# Patient Record
Sex: Male | Born: 1965
Health system: Southern US, Community
[De-identification: ages and names within clinical notes are randomized; demographics above are authoritative.]

## PROBLEM LIST (undated history)

## (undated) DIAGNOSIS — R112 Nausea with vomiting, unspecified: Secondary | ICD-10-CM

## (undated) DIAGNOSIS — Z9889 Other specified postprocedural states: Secondary | ICD-10-CM

## (undated) DIAGNOSIS — H409 Unspecified glaucoma: Secondary | ICD-10-CM

## (undated) DIAGNOSIS — E785 Hyperlipidemia, unspecified: Secondary | ICD-10-CM

## (undated) DIAGNOSIS — R011 Cardiac murmur, unspecified: Secondary | ICD-10-CM

## (undated) DIAGNOSIS — T8859XA Other complications of anesthesia, initial encounter: Secondary | ICD-10-CM

## (undated) DIAGNOSIS — L719 Rosacea, unspecified: Secondary | ICD-10-CM

## (undated) HISTORY — PX: VASECTOMY: SHX75

## (undated) HISTORY — DX: Hyperlipidemia, unspecified: E78.5

## (undated) HISTORY — DX: Unspecified glaucoma: H40.9

## (undated) HISTORY — DX: Rosacea, unspecified: L71.9

## (undated) HISTORY — PX: ANKLE SURGERY: SHX546

## (undated) HISTORY — DX: Cardiac murmur, unspecified: R01.1

## (undated) HISTORY — PX: TONSILLECTOMY AND ADENOIDECTOMY: SUR1326

## (undated) HISTORY — PX: COLONOSCOPY: SHX174

---

## 2004-10-30 ENCOUNTER — Emergency Department (HOSPITAL_COMMUNITY): Admission: EM | Admit: 2004-10-30 | Discharge: 2004-10-30 | Payer: Self-pay | Admitting: Emergency Medicine

## 2004-12-30 HISTORY — PX: SHOULDER SURGERY: SHX246

## 2005-11-27 ENCOUNTER — Ambulatory Visit: Payer: Self-pay | Admitting: Internal Medicine

## 2010-09-05 ENCOUNTER — Ambulatory Visit: Payer: Self-pay | Admitting: Sports Medicine

## 2010-09-05 DIAGNOSIS — M25569 Pain in unspecified knee: Secondary | ICD-10-CM | POA: Insufficient documentation

## 2010-09-05 DIAGNOSIS — E785 Hyperlipidemia, unspecified: Secondary | ICD-10-CM | POA: Insufficient documentation
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
## 2011-07-08 ENCOUNTER — Ambulatory Visit (INDEPENDENT_AMBULATORY_CARE_PROVIDER_SITE_OTHER): Payer: BC Managed Care – PPO | Admitting: Sports Medicine

## 2011-07-08 ENCOUNTER — Encounter: Payer: Self-pay | Admitting: Sports Medicine

## 2011-07-08 VITALS — BP 119/78 | HR 56 | Ht 72.0 in | Wt 195.0 lb

## 2011-07-08 DIAGNOSIS — M25572 Pain in left ankle and joints of left foot: Secondary | ICD-10-CM

## 2011-07-08 DIAGNOSIS — M25579 Pain in unspecified ankle and joints of unspecified foot: Secondary | ICD-10-CM

## 2011-07-08 NOTE — Assessment & Plan Note (Addendum)
Try conservative care with ASO Limit plantar flexion Would not push exercises but rather limit plantar flexion over next 6 weeks  Ck ankle films   Recheck in 6 weeks

## 2011-07-08 NOTE — Progress Notes (Signed)
  Subjective:    Patient ID: URBAN NAVAL, male    DOB: [birth year], 45 y.o.   MRN: [id number]  HPI  Pt presents to clinic today for evaluation of left posterior ankle pain that he has had for the past 6 months started when he was training for a marathon this past winter.  Pain is directly behind lateral and medial malleolus with plantar flexion.  Completed marathon this past May.  Pain worsened 1.5 months ago when he stepped wrong off of a step into extreme plantar flexion.  Now has ankle pain  Hx of achilles tendonitis x 3-4 years ago, thought this resolved.    this flared after running a marathon.  He has a history of remote right ankle surgery for an os trigone.   Review of Systems     Objective:   Physical Exam    Klieger test neg Ankle ligaments stable No swelling Tenderness is minimal Pain with ankle plantar flexion at 30 deg AT normal Mildly suppinated foot with bunionettes Normal arch No calcaneal valgus Scar on rt medial ankle- os trigon surgery    MSK [place] The AT is intact and normal at 0.42 cms Peroneal tendons are intact and normal on long and trans view Calcaneus appears normal Deep in post clear space there is an indistinct circular area of tissue that may be a fibrous type nodule No bony os trigone is identified on [place] at least      Assessment & Plan:

## 2011-07-09 ENCOUNTER — Ambulatory Visit
Admission: RE | Admit: 2011-07-09 | Discharge: 2011-07-09 | Disposition: A | Discharge status: Home / Self Care | Payer: BC Managed Care – PPO | Source: Ambulatory Visit | Attending: Sports Medicine | Admitting: Sports Medicine

## 2011-07-09 ENCOUNTER — Other Ambulatory Visit: Payer: Self-pay | Admitting: Sports Medicine

## 2011-07-09 DIAGNOSIS — M25572 Pain in left ankle and joints of left foot: Secondary | ICD-10-CM

## 2011-07-10 ENCOUNTER — Telehealth: Payer: Self-pay | Admitting: *Deleted

## 2011-07-10 NOTE — Telephone Encounter (Signed)
Per Dr. Darrick Penna advised pt he does not have a bony os trigon, but may have a cartilaginous os trigon, and continue treatment plan as discussed.  Pt expressed understanding.   Pt wanted to know if it was ok for him to ride his road bike and do lower body resistance training such as leg press.   Per Dr. Darrick Penna advised pt- ok to ride road bike, but not uphill, and he should avoid activities that put foot in plantar flexion such as leg presses, and squats.

## 2011-08-06 ENCOUNTER — Ambulatory Visit: Payer: BC Managed Care – PPO | Admitting: Sports Medicine

## 2011-10-10 ENCOUNTER — Ambulatory Visit (INDEPENDENT_AMBULATORY_CARE_PROVIDER_SITE_OTHER): Payer: BC Managed Care – PPO | Admitting: Sports Medicine

## 2011-10-10 ENCOUNTER — Encounter: Payer: Self-pay | Admitting: Sports Medicine

## 2011-10-10 VITALS — BP 138/88 | HR 58

## 2011-10-10 DIAGNOSIS — M25572 Pain in left ankle and joints of left foot: Secondary | ICD-10-CM

## 2011-10-10 DIAGNOSIS — M25579 Pain in unspecified ankle and joints of unspecified foot: Secondary | ICD-10-CM

## 2011-10-10 NOTE — Progress Notes (Signed)
  Subjective:    Patient ID: Stephen Arellano, male    DOB: 1966-01-20, 45 y.o.   MRN: [id number]  HPI  Pt presents to clinic for f/u of lt posterior ankle pain which he reports is improved in daily walking, but still painful with plantar flexion. States with forced plantar flexion, such as stepping down a steep step gets pain Uses ASO consistently since last visit, and limited motion.  No injuries since last visit He still gets too much pain in his posterior ankle to run  X-ray showed a prominent posterior process of the talus  He had similar symptoms in the right ankle 17 years ago and had surgery for an os trigonum and that resolved his pain       Review of Systems     Objective:   Physical Exam  No acute distress  Ankle: No visible erythema or swelling. Range of motion is full in all directions. Strength is 5/5 in all directions. Stable lateral and medial ligaments; squeeze test and kleiger test unremarkable; Talar dome nontender; No pain at base of 5th MT; No tenderness over cuboid; No tenderness over N spot or navicular prominence No tenderness on posterior aspects of lateral and medial malleolus No sign of peroneal tendon subluxations; Negative tarsal tunnel tinel's Able to walk 4 steps.  MSK ultrasound The Achilles tendon and the ankle looks normal on scan In the posterior talus there is a calcified spur There is some soft tissue swelling around this as well as some probable calcification in the soft tissue       Assessment & Plan:

## 2011-10-10 NOTE — Assessment & Plan Note (Signed)
I think that the spur and the prominent posterior process of the talus are symptomatically acting like an os trigonum injury  Reviewed these findings with the orthopedic surgeon to see if surgical intervention might be helpful

## 2012-01-09 ENCOUNTER — Telehealth: Payer: Self-pay | Admitting: Internal Medicine

## 2012-01-09 NOTE — Telephone Encounter (Signed)
Please bring me his chart for review. Usually when a patient transfers, they did so for a valid reason.

## 2012-01-09 NOTE — Telephone Encounter (Signed)
Dr.Hopper please advise 

## 2012-01-09 NOTE — Telephone Encounter (Signed)
Patient states that the last time he saw Dr. Alwyn Ren was four years ago but would like to re establish with him. Please advise if this is okay.

## 2012-01-10 NOTE — Telephone Encounter (Signed)
Chart is on the ledge.

## 2012-01-10 NOTE — Telephone Encounter (Signed)
Patient made appt for 01/15/12

## 2012-01-10 NOTE — Telephone Encounter (Signed)
OK; he should F/U his elevated cholesterol

## 2012-01-10 NOTE — Telephone Encounter (Signed)
lmom informing patient that per it was okay to re establish per Dr. Alwyn Ren.

## 2012-01-15 ENCOUNTER — Encounter: Payer: Self-pay | Admitting: Internal Medicine

## 2012-01-15 ENCOUNTER — Ambulatory Visit (INDEPENDENT_AMBULATORY_CARE_PROVIDER_SITE_OTHER): Payer: BC Managed Care – PPO | Admitting: Internal Medicine

## 2012-01-15 DIAGNOSIS — Z0000 Encounter for general adult medical examination without abnormal findings: Secondary | ICD-10-CM

## 2012-01-15 DIAGNOSIS — E785 Hyperlipidemia, unspecified: Secondary | ICD-10-CM

## 2012-01-15 LAB — BASIC METABOLIC PANEL
BUN: 11 mg/dL (ref 6–23)
Calcium: 9.1 mg/dL (ref 8.4–10.5)
Creatinine, Ser: 1 mg/dL (ref 0.4–1.5)
GFR: 87.31 mL/min (ref >60.00)
Glucose, Bld: 102 mg/dL — ABNORMAL HIGH (ref 70–99)
Potassium: 3.8 mEq/L (ref 3.5–5.1)
Sodium: 140 mEq/L (ref 135–145)

## 2012-01-15 LAB — LIPID PANEL
Cholesterol: 224 mg/dL — ABNORMAL HIGH (ref 0–200)
HDL: 51.2 mg/dL (ref >39.00)
Total CHOL/HDL Ratio: 4
VLDL: 12.2 mg/dL (ref 0.0–40.0)

## 2012-01-15 LAB — CBC WITH DIFFERENTIAL/PLATELET
Basophils Absolute: 0 10*3/uL (ref 0.0–0.1)
Basophils Relative: 0.6 % (ref 0.0–3.0)
Lymphocytes Relative: 31.9 % (ref 12.0–46.0)
Lymphs Abs: 1.8 10*3/uL (ref 0.7–4.0)
MCV: 85.8 fl (ref 78.0–100.0)
Monocytes Absolute: 0.5 10*3/uL (ref 0.1–1.0)
Monocytes Relative: 8.9 % (ref 3.0–12.0)
Neutro Abs: 3.1 10*3/uL (ref 1.4–7.7)
Platelets: 210 10*3/uL (ref 150.0–400.0)
RBC: 4.83 Mil/uL (ref 4.22–5.81)
RDW: 14 % (ref 11.5–14.6)
WBC: 5.8 10*3/uL (ref 4.5–10.5)

## 2012-01-15 LAB — HEPATIC FUNCTION PANEL
AST: 30 U/L (ref 0–37)
Alkaline Phosphatase: 54 U/L (ref 39–117)
Total Bilirubin: 0.7 mg/dL (ref 0.3–1.2)
Total Protein: 7 g/dL (ref 6.0–8.3)

## 2012-01-15 LAB — TSH: TSH: 0.72 u[IU]/mL (ref 0.35–5.50)

## 2012-01-15 NOTE — Patient Instructions (Signed)
Preventive Health Care: Exercise at least 30-45 minutes a day,  3-4 days a week.  Eat a low-fat diet with lots of fruits and vegetables, up to 7-9 servings per day. Consume less than 40 grams of sugar per day from foods & drinks with High Fructose Corn Sugar as # 1,2,3 or # 4 on label. Alcohol If you drink, do it moderately,less than 9 drinks per week, preferably less than 6 @ most. Health Care Power of Attorney & Living Will. Complete if not in place ; these place you in charge of your health care decisions. Blood Pressure Goal  Ideally is an AVERAGE < 135/85. This AVERAGE should be calculated from @ least 5-7 BP readings taken @ different times of day on different days of week. You should not respond to isolated BP readings , but rather the AVERAGE for that week.  To prevent palpitations or premature beats, avoid stimulants such as decongestants, diet pills, nicotine, or caffeine (coffee, tea, cola, or chocolate) to excess.

## 2012-01-15 NOTE — Progress Notes (Signed)
Subjective:    Patient ID: Stephen Arellano, male    DOB: 11/17/1966, 46 y.o.   MRN: [id number]  HPI  Stephen Arellano is here for a physical;acute issues include possible HTN      Review of Systems ? HYPERTENSION: Onset : 11/2011; BP 145/90 @ [facility] Trigger: ? Increased salt intake Disease Monitoring  Blood pressure range: 122/80-high above  Chest pain: no   Dyspnea: no DOE ; he has awakened infrequently with palpitations and dyspnea. He has no palpitations with exertion.  Claudication: no   Lightheadedness: no   Urinary frequency: no   Edema: no    Preventitive Healthcare:  Exercise: yes, 3X/week as gym & biking 40-50 miles per week . He completed a marathon  05/12  Diet Pattern: no plan  Salt Restriction: modified FH: MGF HTN, MI  He denies hematuria or dysuria. He also denies abdominal pain, melena, or rectal bleeding.      Objective:   Physical Exam Gen.: Healthy , toned & well-nourished in appearance. Alert, appropriate and cooperative throughout exam. Head: Normocephalic without obvious abnormalities  Eyes: No corneal or conjunctival inflammation noted. Pupils: ? minimal  anisoria, OS > OD. Slight ptosis OD. Fundal exam is benign without hemorrhages, exudate, papilledema. Extraocular motion intact. Vision grossly normal. Ears: External  ear exam reveals no significant lesions or deformities. Canals clear; minimal osteomata  .TMs normal. Hearing is grossly normal bilaterally. Nose: External nasal exam reveals no deformity or inflammation. Nasal mucosa are pink and moist. No lesions or exudates noted.   Mouth: Oral mucosa and oropharynx reveal no lesions or exudates. Teeth in good repair. Neck: No deformities, masses, or tenderness noted. Range of motion & Thyroid normal. Lungs: Normal respiratory effort; chest expands symmetrically. Lungs are clear to auscultation without rales, wheezes, or increased work of breathing. Heart: Normal rate and rhythm. Normal S1 and S2. No gallop,  click, or rub. Grade 1/6 systolic murmur  Abdomen: Bowel sounds normal; abdomen soft and nontender. No masses, organomegaly or hernias noted. Genitalia/DRE:  Varices and granuloma on the left. Prostate is normal without enlargement, nodularity, asymmetry, or induration .                                                                                   Musculoskeletal/extremities: No deformity or scoliosis noted of  the thoracic or lumbar spine. No clubbing, cyanosis, edema, or deformity noted. Range of motion  normal .Tone & strength  normal.Joints normal. Nail health  good. Vascular: Carotid, radial artery, dorsalis pedis and  posterior tibial pulses are full and equal. No bruits present. Neurologic: Alert and oriented x3. Deep tendon reflexes symmetrical but 1/2+ @ knees.        Skin: Intact without suspicious lesions or rashes. Lymph: No cervical, axillary, or inguinal lymphadenopathy present. Psych: Mood and affect are normal. Normally interactive  Assessment & Plan:  #1 comprehensive physical exam; no acute findings  #2 palpitations and dyspnea infrequently at night. No exercise associated symptoms. If symptoms recur , Holter monitor could be pursued. #3 see Problem List with Assessments & Recommendations Plan: see Orders

## 2012-01-21 LAB — HEMOGLOBIN A1C: Hgb A1c MFr Bld: 5.5 % (ref 4.6–6.5)

## 2012-02-12 ENCOUNTER — Ambulatory Visit (INDEPENDENT_AMBULATORY_CARE_PROVIDER_SITE_OTHER): Payer: BC Managed Care – PPO | Admitting: Sports Medicine

## 2012-02-12 VITALS — BP 120/80 | Ht 72.0 in | Wt 195.0 lb

## 2012-02-12 DIAGNOSIS — M25519 Pain in unspecified shoulder: Secondary | ICD-10-CM

## 2012-02-12 DIAGNOSIS — M25512 Pain in left shoulder: Secondary | ICD-10-CM | POA: Insufficient documentation

## 2012-02-12 MED ORDER — MELOXICAM 15 MG PO TABS: ORAL_TABLET | ORAL | Status: DC

## 2012-02-12 NOTE — Progress Notes (Signed)
  Subjective:    Patient ID: Stephen Arellano, male    DOB: 1966/11/29, 46 y.o.   MRN: [id number]  HPI Damaso comes in 2 weeks after a skiing injury. He doesn't remember exactly how he fell, or what position his arm went, however he has pain over the upper deltoid, on the lateral aspect. Pain is worst with extension and abduction type activities. He has no swelling. He does get occasional popping, clicking, catching.  Past medical history, surgical history, family history, social history, allergies, and medications reviewed from the medical record and no changes needed. Review of Systems    No fevers, chills, night sweats, weight loss, chest pain, or shortness of breath.  Social History: Non-smoker. Objective:   Physical Exam General:  Well developed, well nourished, and in no acute distress. Neuro:  Alert and oriented x3, extra-ocular muscles intact. Skin: Warm and dry, no rashes noted. Respiratory:  Not using accessory muscles, speaking in full sentences. Musculoskeletal: Shoulder: Inspection reveals no abnormalities, atrophy or asymmetry. Palpation is normal with no tenderness over AC joint or bicipital groove. ROM is full in all planes. Rotator cuff strength normal throughout. No signs of impingement with negative Neer and Hawkin's tests, empty can sign. Speeds and Yergason's tests normal. Positive clunk,  positive O'Brien's test. Positive crank sign. Normal scapular function observed. No painful arc and no drop arm sign. No apprehension sign  [facility]K ultrasound: Imaged all cuff tendons all were intact. Posterior labrum was intact. No effusion. Biceps tendon intact. Images saved     Assessment & Plan:

## 2012-02-12 NOTE — Assessment & Plan Note (Signed)
Suspect sprain, some labral signs. Meloxicam. RTC 3-4 weeks. Likely MR arthrogram if no better.

## 2012-02-12 NOTE — Patient Instructions (Signed)
Shoulder sprain. Meloxicam. Come back to see [place] in 3-4 weeks to see how you are doing. Do the rehab exercises.

## 2012-03-10 ENCOUNTER — Ambulatory Visit (INDEPENDENT_AMBULATORY_CARE_PROVIDER_SITE_OTHER): Payer: BC Managed Care – PPO | Admitting: Sports Medicine

## 2012-03-10 VITALS — BP 120/80

## 2012-03-10 DIAGNOSIS — M25512 Pain in left shoulder: Secondary | ICD-10-CM

## 2012-03-10 DIAGNOSIS — M25519 Pain in unspecified shoulder: Secondary | ICD-10-CM

## 2012-03-10 NOTE — Assessment & Plan Note (Signed)
This has not responded to conservative care  No real change in sxs  Mechanism of injury (fall back onto outstretched arm - snow boarding) and exam is consistent with labral or possible SLAP injury  Refer to Dr Dion Saucier for more evaluation

## 2012-03-10 NOTE — Patient Instructions (Signed)
We have scheduled you 03/19/12 at 3:15pm with Dr. Dion Saucier at [facility].  Their phone number is [phone].

## 2012-03-10 NOTE — Progress Notes (Signed)
Subjective:    Patient ID: Stephen Arellano is a 46 y.o. male.  Chief Complaint: HPI: Pt was last evaluated in February for left shoulder pain incurred after a skiing injury; a few days ago he noticed his shoulder was particularly sore after windsurfing. previous rotator cuff U/S revealed no abnormal findings. Pt continues to experience discomfort. PT has been compliant with exercises and continues to take maloxicam; he will occasionally take ibuprofen for pain relief. No point tenderness per patient. No additional injuries/trauma per patient  Note history of RT shoulder reconstruction after injury - By dr Candie Mile at [facility] years ago    Social History   Occupational History  . Not on file.   Social History Main Topics  . Smoking status: Never Smoker   . Smokeless tobacco: Never Used  . Alcohol Use: Yes     10-11 drinks/ week  . Drug Use: No  . Sexually Active: Not on file    ROS   Negative except otherwise noted in the HPI.     Objective:   Ortho Exam  Inspection: No appreciable abnormality, atrophy or asymmetry Palpation: no point tenderness appreciated over bilateral clavicles, [place]/AC joints Full ROM in shoulder Negative speed's, empty can test Difficulty with posterior push off test  Negative Neer and Hawkin's tests Positive clunk  + Obrien test  Assessment:     Suspected labral tear     Plan:    Refer to Dr Dion Saucier He will need Xrays and possible MRA

## 2012-03-19 ENCOUNTER — Other Ambulatory Visit: Payer: Self-pay | Admitting: Orthopedic Surgery

## 2012-03-19 DIAGNOSIS — M25512 Pain in left shoulder: Secondary | ICD-10-CM

## 2012-03-25 ENCOUNTER — Ambulatory Visit
Admission: RE | Admit: 2012-03-25 | Discharge: 2012-03-25 | Disposition: A | Discharge status: Home / Self Care | Payer: BC Managed Care – PPO | Source: Ambulatory Visit | Attending: Orthopedic Surgery | Admitting: Orthopedic Surgery

## 2012-03-25 DIAGNOSIS — M25512 Pain in left shoulder: Secondary | ICD-10-CM

## 2012-03-25 MED ORDER — IOHEXOL 180 MG/ML  SOLN
15.0000 mL | Freq: Once | INTRAMUSCULAR | Status: AC | PRN
  Administered 2012-03-25: 15 mL via INTRAVENOUS

## 2016-11-07 ENCOUNTER — Other Ambulatory Visit: Payer: Self-pay | Admitting: Orthopedic Surgery

## 2017-05-09 ENCOUNTER — Encounter: Payer: Self-pay | Admitting: Internal Medicine

## 2017-05-16 ENCOUNTER — Encounter: Payer: Self-pay | Admitting: Internal Medicine

## 2018-05-28 DIAGNOSIS — E7849 Other hyperlipidemia: Secondary | ICD-10-CM | POA: Diagnosis not present

## 2018-05-28 DIAGNOSIS — Z0000 Encounter for general adult medical examination without abnormal findings: Secondary | ICD-10-CM | POA: Diagnosis not present

## 2018-06-04 ENCOUNTER — Encounter: Payer: Self-pay | Admitting: Gastroenterology

## 2018-06-04 DIAGNOSIS — R030 Elevated blood-pressure reading, without diagnosis of hypertension: Secondary | ICD-10-CM | POA: Diagnosis not present

## 2018-06-04 DIAGNOSIS — R7989 Other specified abnormal findings of blood chemistry: Secondary | ICD-10-CM | POA: Diagnosis not present

## 2018-06-04 DIAGNOSIS — Z0000 Encounter for general adult medical examination without abnormal findings: Secondary | ICD-10-CM | POA: Diagnosis not present

## 2018-06-04 DIAGNOSIS — Z1389 Encounter for screening for other disorder: Secondary | ICD-10-CM | POA: Diagnosis not present

## 2018-06-04 DIAGNOSIS — R7309 Other abnormal glucose: Secondary | ICD-10-CM | POA: Diagnosis not present

## 2018-06-05 DIAGNOSIS — Z1212 Encounter for screening for malignant neoplasm of rectum: Secondary | ICD-10-CM | POA: Diagnosis not present

## 2018-06-09 ENCOUNTER — Other Ambulatory Visit: Payer: Self-pay | Admitting: Internal Medicine

## 2018-06-09 DIAGNOSIS — E785 Hyperlipidemia, unspecified: Secondary | ICD-10-CM

## 2018-06-22 ENCOUNTER — Other Ambulatory Visit: Payer: Self-pay

## 2018-06-25 ENCOUNTER — Ambulatory Visit (AMBULATORY_SURGERY_CENTER): Payer: Self-pay

## 2018-06-25 ENCOUNTER — Other Ambulatory Visit: Payer: Self-pay

## 2018-06-25 VITALS — Ht 72.0 in | Wt 202.6 lb

## 2018-06-25 DIAGNOSIS — Z1211 Encounter for screening for malignant neoplasm of colon: Secondary | ICD-10-CM

## 2018-06-25 MED ORDER — PEG-KCL-NACL-NASULF-NA ASC-C 140 G PO SOLR
1.0000 | Freq: Once | ORAL | 0 refills | Status: AC
Start: 2018-06-25 — End: 2018-06-25

## 2018-06-25 NOTE — Progress Notes (Signed)
No egg or soy allergy known to patient  No issues with past sedation with any surgeries  or procedures, no intubation problems  No diet pills per patient No home 02 use per patient  No blood thinners per patient  Pt denies issues with constipation  No A fib or A flutter  EMMI video sent to pt's e mail video sent in pv

## 2018-06-29 ENCOUNTER — Encounter: Payer: Self-pay | Admitting: Gastroenterology

## 2018-07-01 DIAGNOSIS — R7989 Other specified abnormal findings of blood chemistry: Secondary | ICD-10-CM | POA: Diagnosis not present

## 2018-07-09 ENCOUNTER — Encounter: Payer: Self-pay | Admitting: Gastroenterology

## 2018-07-09 ENCOUNTER — Ambulatory Visit (AMBULATORY_SURGERY_CENTER): Payer: 59 | Admitting: Gastroenterology

## 2018-07-09 VITALS — BP 99/68 | HR 55 | Temp 98.2°F | Resp 14 | Ht 72.0 in | Wt 202.0 lb

## 2018-07-09 DIAGNOSIS — Z1211 Encounter for screening for malignant neoplasm of colon: Secondary | ICD-10-CM

## 2018-07-09 DIAGNOSIS — D120 Benign neoplasm of cecum: Secondary | ICD-10-CM | POA: Diagnosis not present

## 2018-07-09 DIAGNOSIS — K635 Polyp of colon: Secondary | ICD-10-CM | POA: Diagnosis not present

## 2018-07-09 MED ORDER — SODIUM CHLORIDE 0.9 % IV SOLN: 500.0000 mL | Freq: Once | INTRAVENOUS | Status: DC

## 2018-07-09 NOTE — Progress Notes (Signed)
Pt's states no medical or surgical changes since previsit or office visit. 

## 2018-07-09 NOTE — Progress Notes (Signed)
Called to room to assist during endoscopic procedure.  Patient ID and intended procedure confirmed with present staff. Received instructions for my participation in the procedure from the performing physician.  

## 2018-07-09 NOTE — Patient Instructions (Signed)
YOU HAD AN ENDOSCOPIC PROCEDURE TODAY AT THE [facility]:   Refer to the procedure report that was given to you for any specific questions about what was found during the examination.  If the procedure report does not answer your questions, please call your gastroenterologist to clarify.  If you requested that your care partner not be given the details of your procedure findings, then the procedure report has been included in a sealed envelope for you to review at your convenience later.  YOU SHOULD EXPECT: Some feelings of bloating in the abdomen. Passage of more gas than usual.  Walking can help get rid of the air that was put into your GI tract during the procedure and reduce the bloating. If you had a lower endoscopy (such as a colonoscopy or flexible sigmoidoscopy) you may notice spotting of blood in your stool or on the toilet paper. If you underwent a bowel prep for your procedure, you may not have a normal bowel movement for a few days.  Please Note:  You might notice some irritation and congestion in your nose or some drainage.  This is from the oxygen used during your procedure.  There is no need for concern and it should clear up in a day or so.  SYMPTOMS TO REPORT IMMEDIATELY:   Following lower endoscopy (colonoscopy or flexible sigmoidoscopy):  Excessive amounts of blood in the stool  Significant tenderness or worsening of abdominal pains  Swelling of the abdomen that is new, acute  Fever of 100F or higher   For urgent or emergent issues, a gastroenterologist can be reached at any hour by calling [phone].   DIET:  We do recommend a small meal at first, but then you may proceed to your regular diet.  Drink plenty of fluids but you should avoid alcoholic beverages for 24 hours.  ACTIVITY:  You should plan to take it easy for the rest of today and you should NOT DRIVE or use heavy machinery until tomorrow (because of the sedation medicines used during the test).     FOLLOW UP: Our staff will call the number listed on your records the next business day following your procedure to check on you and address any questions or concerns that you may have regarding the information given to you following your procedure. If we do not reach you, we will leave a message.  However, if you are feeling well and you are not experiencing any problems, there is no need to return our call.  We will assume that you have returned to your regular daily activities without incident.  If any biopsies were taken you will be contacted by phone or by letter within the next 1-3 weeks.  Please call us at [phone] if you have not heard about the biopsies in 3 weeks.    SIGNATURES/CONFIDENTIALITY: You and/or your care partner have signed paperwork which will be entered into your electronic medical record.  These signatures attest to the fact that that the information above on your After Visit Summary has been reviewed and is understood.  Full responsibility of the confidentiality of this discharge information lies with you and/or your care-partner.  Read all handouts given to you by your recovery room nurse. 

## 2018-07-09 NOTE — Progress Notes (Signed)
Report given to PACU, vss 

## 2018-07-09 NOTE — Op Note (Signed)
[facility] [facility] Patient Name: Stephen Arellano Procedure Date: 07/09/2018 1:29 PM MRN: [id number] Endoscopist: Mallie Mussel L. Loletha Carrow , MD Age: 52 Referring MD:  Date of Birth: Mar 22, 1966 Gender: Male Account #: [id number] Procedure:                Colonoscopy Indications:              Screening for colorectal malignant neoplasm, This                            is the patient's first colonoscopy Medicines:                Monitored Anesthesia Care Procedure:                Pre-Anesthesia Assessment:                           - Prior to the procedure, a History and Physical                            was performed, and patient medications and                            allergies were reviewed. The patient's tolerance of                            previous anesthesia was also reviewed. The risks                            and benefits of the procedure and the sedation                            options and risks were discussed with the patient.                            All questions were answered, and informed consent                            was obtained. Prior Anticoagulants: The patient has                            taken no previous anticoagulant or antiplatelet                            agents. ASA Grade Assessment: I - A normal, healthy                            patient. After reviewing the risks and benefits,                            the patient was deemed in satisfactory condition to                            undergo the procedure.  After obtaining informed consent, the colonoscope                            was passed under direct vision. Throughout the                            procedure, the patient's blood pressure, pulse, and                            oxygen saturations were monitored continuously. The                            Colonoscope was introduced through the anus and                            advanced to the the cecum, identified by                             appendiceal orifice and ileocecal valve. The                            colonoscopy was performed without difficulty. The                            patient tolerated the procedure well. The quality                            of the bowel preparation was excellent. The                            ileocecal valve, appendiceal orifice, and rectum                            were photographed. The quality of the bowel                            preparation was evaluated using the BBPS [facility]                            Bowel Preparation Scale) with scores of: Right                            Colon = 3, Transverse Colon = 3 and Left Colon = 3                            (entire mucosa seen well with no residual staining,                            small fragments of stool or opaque liquid). The                            total BBPS score equals 9. Scope In: 1:34:38 PM Scope Out: 1:51:13 PM Scope Withdrawal Time: 0 hours 11  minutes 39 seconds  Total Procedure Duration: 0 hours 16 minutes 35 seconds  Findings:                 The perianal and digital rectal examinations were                            normal.                           The sigmoid colon was redundant.                           A 8 mm polyp was found in the cecum. The polyp was                            sessile with a mucus cap. The polyp was removed                            with a cold snare. Resection and retrieval were                            complete.                           Retroflexion in the rectum was not performed due to                            anatomy.                           The exam was otherwise without abnormality. Complications:            No immediate complications. Estimated Blood Loss:     Estimated blood loss was minimal. Impression:               - Redundant colon.                           - One 8 mm polyp in the cecum, removed with a cold                             snare. Resected and retrieved.                           - The examination was otherwise normal. Recommendation:           - Patient has a contact number available for                            emergencies. The signs and symptoms of potential                            delayed complications were discussed with the                            patient. Return to normal activities tomorrow.  Written discharge instructions were provided to the                            patient.                           - Resume previous diet.                           - Continue present medications.                           - Await pathology results.                           - Repeat colonoscopy is recommended for                            surveillance. The colonoscopy date will be                            determined after pathology results from today's                            exam become available for review.  L. Loletha Carrow, MD 07/09/2018 1:59:13 PM This report has been signed electronically.

## 2018-07-10 ENCOUNTER — Telehealth: Payer: Self-pay | Admitting: *Deleted

## 2018-07-10 NOTE — Telephone Encounter (Signed)
  Follow up Call-  Call back number 07/09/2018  Post procedure Call Back phone  # [phone]  Permission to leave phone message Yes  Some recent data might be hidden     Patient questions:  Message left to call us if necessary.  Second call.

## 2018-07-10 NOTE — Telephone Encounter (Signed)
  Follow up Call-  Call back number 07/09/2018  Post procedure Call Back phone  # [phone]  Permission to leave phone message Yes  Some recent data might be hidden     Patient questions:  Message left to call us if necessary.

## 2018-07-13 ENCOUNTER — Ambulatory Visit
Admission: RE | Admit: 2018-07-13 | Discharge: 2018-07-13 | Disposition: A | Discharge status: Home / Self Care | Payer: No Typology Code available for payment source | Source: Ambulatory Visit | Attending: Internal Medicine | Admitting: Internal Medicine

## 2018-07-13 DIAGNOSIS — E785 Hyperlipidemia, unspecified: Secondary | ICD-10-CM

## 2018-07-19 ENCOUNTER — Encounter: Payer: Self-pay | Admitting: Gastroenterology

## 2018-09-29 DIAGNOSIS — H40053 Ocular hypertension, bilateral: Secondary | ICD-10-CM | POA: Diagnosis not present

## 2018-09-29 DIAGNOSIS — H5213 Myopia, bilateral: Secondary | ICD-10-CM | POA: Diagnosis not present

## 2018-09-29 DIAGNOSIS — H40023 Open angle with borderline findings, high risk, bilateral: Secondary | ICD-10-CM | POA: Diagnosis not present

## 2018-10-20 DIAGNOSIS — Z23 Encounter for immunization: Secondary | ICD-10-CM | POA: Diagnosis not present

## 2020-01-29 IMAGING — CT CT HEART SCORING
4 series · 12 of 20 positions shown, 14 images · non-contrast
Comparison: None.

CLINICAL DATA: Hyperlipidemia

EXAM:
CT HEART FOR CALCIUM SCORING
TECHNIQUE: CT heart was performed on a 64 channel system using prospective ECG
gating.
A non-contrast exam for calcium scoring was performed.
Note that this exam targets the heart and the chest was not imaged
in its entirety.

[Series 3: calcium scoring 2.00 qr36 bestdiast 69% · axial · 0.36mm/px · z∈[+1498,+1564]mm · 2 of 100 slices shown]
[im 34/100  vessel]
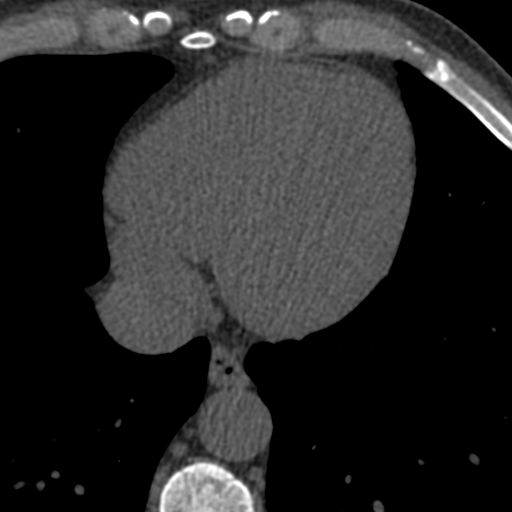
[im 67/100  vessel]
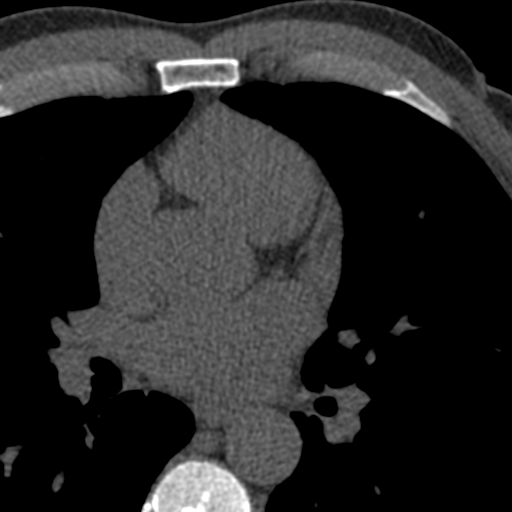

[Series 4: calcium scoring 2.00 br40 bestdiast 69% ax fov · axial · 0.32mm/px · z∈[+1498,+1564]mm · 2 of 100 slices shown]
[im 34/100  vessel]
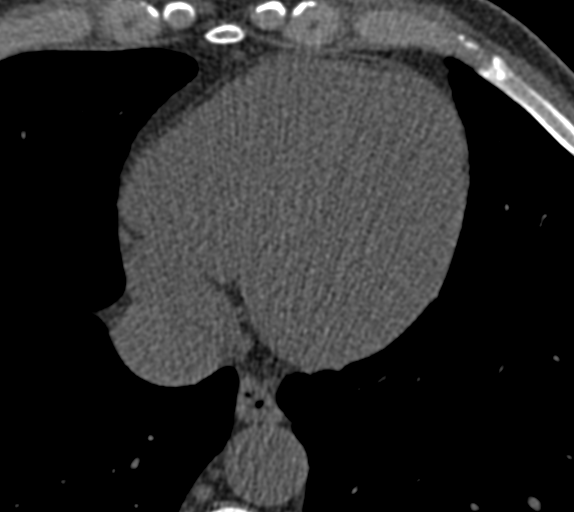
[im 67/100  vessel]
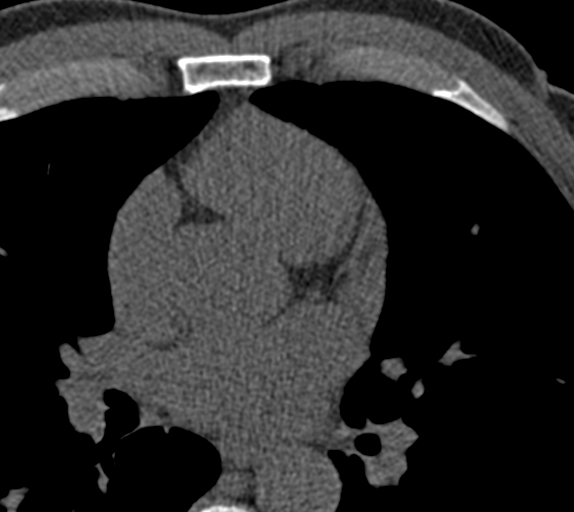

[Series 10: calcium scoring 2.00 br60 bestdiast 69% ax fov · axial · 0.60mm/px · z∈[+1498,+1564]mm · 2 of 100 slices shown]
[im 34/100  vessel]
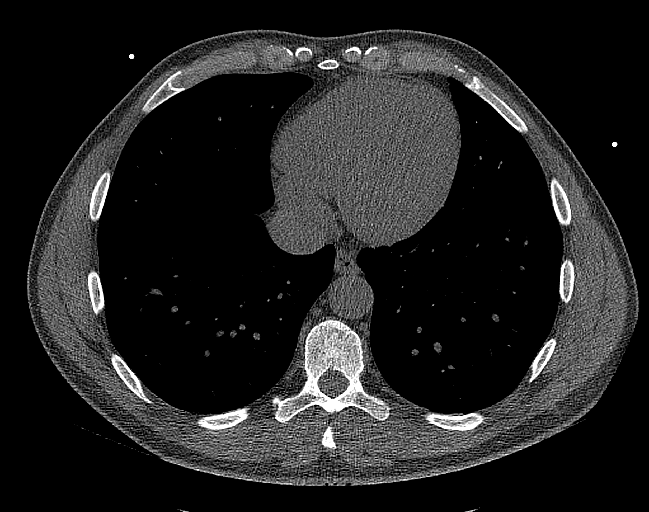
[im 67/100  vessel]
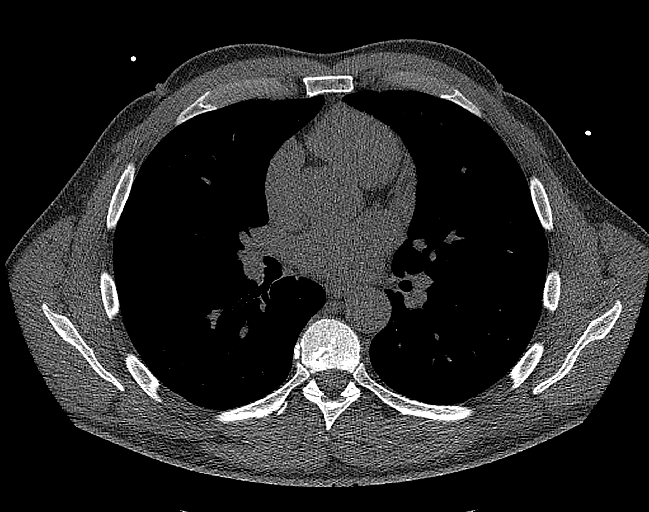

[Series 12: calcium scoring 1.50 qr36 bestdiast 69% · axial · 0.76mm/px · z∈[+1460,+1601]mm · 6 of 199 slices shown, 8 images]
[im 29/199  vessel]
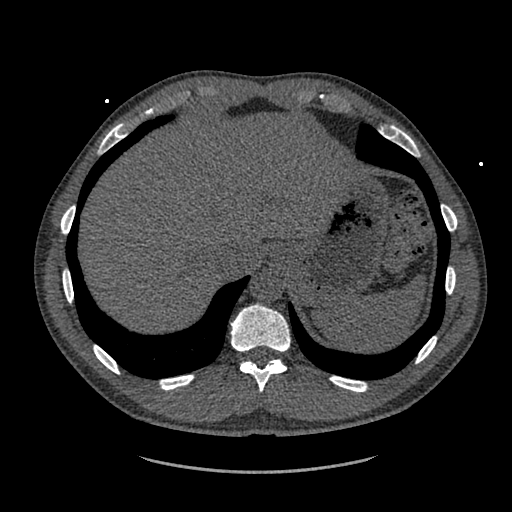
[im 29/199  lung]
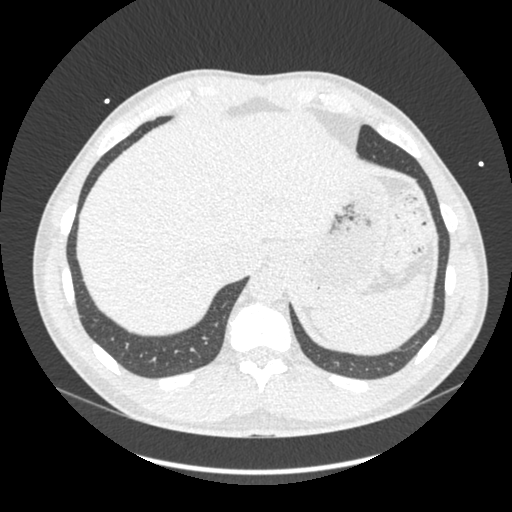
[im 57/199  vessel]
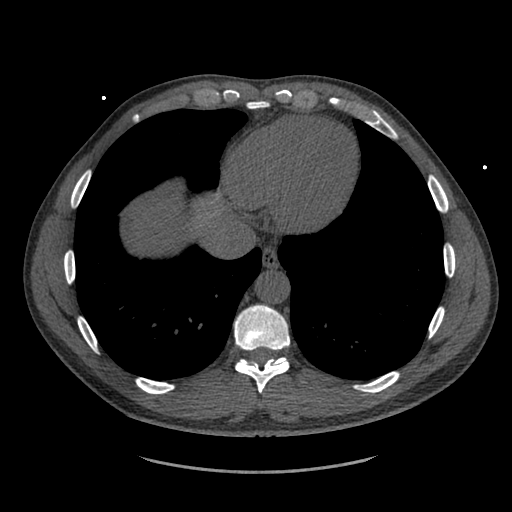
[im 85/199  vessel]
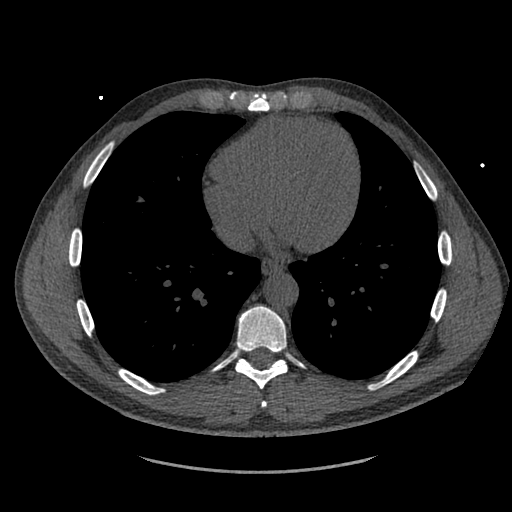
[im 114/199  vessel]
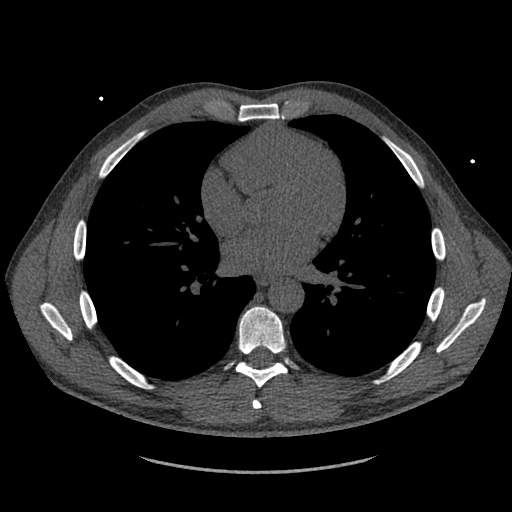
[im 142/199  vessel]
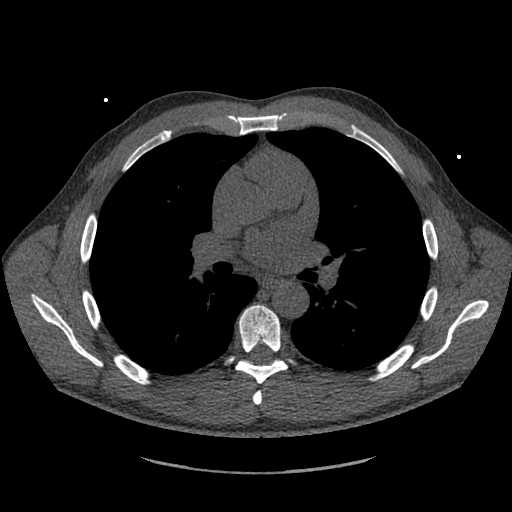
[im 142/199  lung]
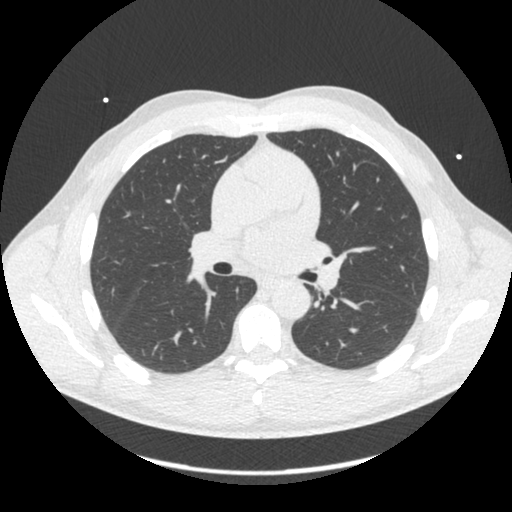
[im 170/199  vessel]
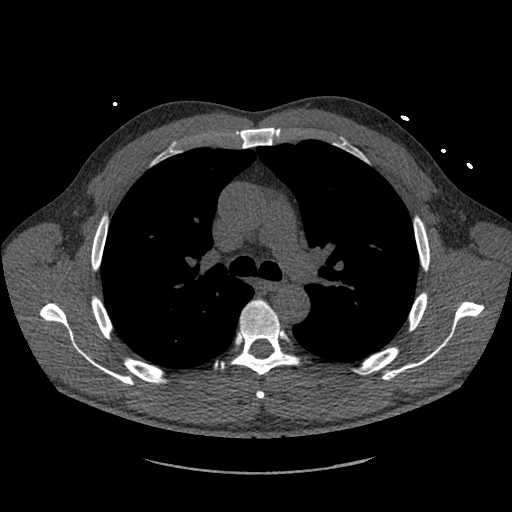

[12 of 20 positions shown; findings below may reference images not displayed]

FINDINGS: Technical quality: Good.

CORONARY CALCIUM

Total Agatston Score: 0

[HOSPITAL] percentile:  0

OTHER FINDINGS:

Cardiovascular: Heart is normal size. Scattered calcifications in
the aortic arch. Aorta is normal caliber.

Mediastinum/Nodes: No adenopathy in the lower mediastinum or hila.

Lungs/Pleura: Visualized lungs clear.  No effusions.

Upper Abdomen: Imaging into the upper abdomen shows no acute
findings.

Musculoskeletal: Chest wall soft tissues are unremarkable. No acute
bony abnormality.
IMPRESSION: No visible coronary artery calcifications. Total coronary calcium
score of 0.

Scattered aortic arch calcifications. No evidence of aortic
aneurysm.

## 2020-12-08 NOTE — Patient Instructions (Signed)
DUE TO COVID-19 ONLY ONE VISITOR IS ALLOWED TO COME WITH YOU AND STAY IN THE WAITING ROOM ONLY DURING PRE OP AND PROCEDURE DAY OF SURGERY. THE 1 VISITOR  MAY VISIT WITH YOU AFTER SURGERY IN YOUR PRIVATE ROOM DURING VISITING HOURS ONLY!  YOU NEED TO HAVE A COVID 19 TEST ON_12/13______ @__10 :50_____, THIS TEST MUST BE DONE BEFORE SURGERY,  COVID TESTING SITE [place] [address], IT IS ON THE RIGHT GOING OUT [address] APPROXIMATELY  2 MINUTES PAST [employer] ON THE RIGHT. ONCE YOUR COVID TEST IS COMPLETED,  PLEASE BEGIN THE QUARANTINE INSTRUCTIONS AS OUTLINED IN YOUR HANDOUT.                Stephen Arellano    Your procedure is scheduled on: 12/14/20   Report to [facility] Main  Entrance   Report to admitting at   8:00 AM     Call this number if you have problems the morning of surgery [phone]    . BRUSH YOUR TEETH MORNING OF SURGERY AND RINSE YOUR MOUTH OUT, NO CHEWING GUM CANDY OR MINTS.   No food after midnight.    You may have clear liquid until 7:00 AM.    At 6:30 AM drink pre surgery drink.   Nothing by mouth after 7:00 AM.   Take these medicines the morning of surgery with A SIP OF WATER: None                                 You may not have any metal on your body including               piercings  Do not wear jewelry,  lotions, powders or deodorant              Men may shave face and neck.   Do not bring valuables to the hospital. [place].  Contacts, dentures or bridgework may not be worn into surgery.      Patients discharged the day of surgery will not be allowed to drive home.   IF YOU ARE HAVING SURGERY AND GOING HOME THE SAME DAY, YOU MUST HAVE AN ADULT TO DRIVE YOU HOME AND BE WITH YOU FOR 24 HOURS.  YOU MAY GO HOME BY TAXI OR UBER OR ORTHERWISE, BUT AN ADULT MUST ACCOMPANY YOU HOME AND STAY WITH YOU FOR 24 HOURS.  Name and phone number of your  driver:  Special Instructions: N/A              Please read over the following fact sheets you were given: _____________________________________________________________________             [facility]- Preparing for Total Shoulder Arthroplasty    Before surgery, you can play an important role. Because skin is not sterile, your skin needs to be as free of germs as possible. You can reduce the number of germs on your skin by using the following products. . Benzoyl Peroxide Gel o Reduces the number of germs present on the skin o Applied twice a day to shoulder area starting two days before surgery    ==================================================================  Please follow these instructions carefully:  BENZOYL PEROXIDE 5% GEL  Please do not use if you have an allergy to benzoyl peroxide.   If  your skin becomes reddened/irritated stop using the benzoyl peroxide.  Starting two days before surgery, apply as follows: 1. Apply benzoyl peroxide in the morning and at night. Apply after taking a shower. If you are not taking a shower clean entire shoulder front, back, and side along with the armpit with a clean wet washcloth.  2. Place a quarter-sized dollop on your shoulder and rub in thoroughly, making sure to cover the front, back, and side of your shoulder, along with the armpit.   2 days before ____ AM   ____ PM              1 day before ____ AM   ____ PM                         3. Do this twice a day for two days.  (Last application is the night before surgery, AFTER using the CHG soap as described below).  4. Do NOT apply benzoyl peroxide gel on the day of surgery.   [facility] - Preparing for Surgery  Before surgery, you can play an important role.   Because skin is not sterile, your skin needs to be as free of germs as possible .  You can reduce the number of germs on your skin by washing with CHG (chlorahexidine gluconate) soap before surgery.   CHG is an antiseptic  cleaner which kills germs and bonds with the skin to continue killing germs even after washing. Please DO NOT use if you have an allergy to CHG or antibacterial soaps.   If your skin becomes reddened/irritated stop using the CHG and inform your nurse when you arrive at Short Stay.   You may shave your face/neck.  Please follow these instructions carefully:  1.  Shower with CHG Soap the night before surgery and the  morning of Surgery.  2.  If you choose to wash your hair, wash your hair first as usual with your  normal  shampoo.  3.  After you shampoo, rinse your hair and body thoroughly to remove the  shampoo.                                        4.  Use CHG as you would any other liquid soap.  You can apply chg directly  to the skin and wash                       Gently with a scrungie or clean washcloth.  5.  Apply the CHG Soap to your body ONLY FROM THE NECK DOWN.   Do not use on face/ open                           Wound or open sores. Avoid contact with eyes, ears mouth and genitals (private parts).                       Wash face,  Genitals (private parts) with your normal soap.             6.  Wash thoroughly, paying special attention to the area where your surgery  will be performed.  7.  Thoroughly rinse your body with warm water from the neck down.  8.  DO NOT shower/wash with your  normal soap after using and rinsing off  the CHG Soap.             9.  Pat yourself dry with a clean towel.            10.  Wear clean pajamas.            11.  Place clean sheets on your bed the night of your first shower and do not  sleep with pets. Day of Surgery : Do not apply any lotions/deodorants the morning of surgery.  Please wear clean clothes to the hospital/surgery center.  FAILURE TO FOLLOW THESE INSTRUCTIONS MAY RESULT IN THE CANCELLATION OF YOUR SURGERY PATIENT SIGNATURE_________________________________  NURSE  SIGNATURE__________________________________  ________________________________________________________________________   Stephen Arellano  An incentive spirometer is a tool that can help keep your lungs clear and active. This tool measures how well you are filling your lungs with each breath. Taking long deep breaths may help reverse or decrease the chance of developing breathing (pulmonary) problems (especially infection) following:  A long period of time when you are unable to move or be active. BEFORE THE PROCEDURE   If the spirometer includes an indicator to show your best effort, your nurse or respiratory therapist will set it to a desired goal.  If possible, sit up straight or lean slightly forward. Try not to slouch.  Hold the incentive spirometer in an upright position. INSTRUCTIONS FOR USE  1. Sit on the edge of your bed if possible, or sit up as far as you can in bed or on a chair. 2. Hold the incentive spirometer in an upright position. 3. Breathe out normally. 4. Place the mouthpiece in your mouth and seal your lips tightly around it. 5. Breathe in slowly and as deeply as possible, raising the piston or the ball toward the top of the column. 6. Hold your breath for 3-5 seconds or for as long as possible. Allow the piston or ball to fall to the bottom of the column. 7. Remove the mouthpiece from your mouth and breathe out normally. 8. Rest for a few seconds and repeat Steps 1 through 7 at least 10 times every 1-2 hours when you are awake. Take your time and take a few normal breaths between deep breaths. 9. The spirometer may include an indicator to show your best effort. Use the indicator as a goal to work toward during each repetition. 10. After each set of 10 deep breaths, practice coughing to be sure your lungs are clear. If you have an incision (the cut made at the time of surgery), support your incision when coughing by placing a pillow or rolled up towels firmly  against it. Once you are able to get out of bed, walk around indoors and cough well. You may stop using the incentive spirometer when instructed by your caregiver.  RISKS AND COMPLICATIONS  Take your time so you do not get dizzy or light-headed.  If you are in pain, you may need to take or ask for pain medication before doing incentive spirometry. It is harder to take a deep breath if you are having pain. AFTER USE  Rest and breathe slowly and easily.  It can be helpful to keep track of a log of your progress. Your caregiver can provide you with a simple table to help with this. If you are using the spirometer at home, follow these instructions: [place] IF:   You are having difficultly using the spirometer.  You have  trouble using the spirometer as often as instructed.  Your pain medication is not giving enough relief while using the spirometer.  You develop fever of 100.5 F (38.1 C) or higher. SEEK IMMEDIATE MEDICAL CARE IF:   You cough up bloody sputum that had not been present before.  You develop fever of 102 F (38.9 C) or greater.  You develop worsening pain at or near the incision site. MAKE SURE YOU:   Understand these instructions.  Will watch your condition.  Will get help right away if you are not doing well or get worse. Document Released: 04/28/2007 Document Revised: 03/09/2012 Document Reviewed: 06/29/2007 [facility] Patient Information 2014 [place], [place].   ________________________________________________________________________

## 2020-12-11 ENCOUNTER — Other Ambulatory Visit: Payer: Self-pay

## 2020-12-11 ENCOUNTER — Encounter (HOSPITAL_COMMUNITY): Payer: Self-pay

## 2020-12-11 ENCOUNTER — Other Ambulatory Visit (HOSPITAL_COMMUNITY)
Admission: RE | Admit: 2020-12-11 | Discharge: 2020-12-11 | Disposition: A | Discharge status: Home / Self Care | Payer: 59 | Source: Ambulatory Visit | Attending: Orthopedic Surgery | Admitting: Orthopedic Surgery

## 2020-12-11 ENCOUNTER — Encounter (HOSPITAL_COMMUNITY)
Admission: RE | Admit: 2020-12-11 | Discharge: 2020-12-11 | Disposition: A | Discharge status: Home / Self Care | Payer: 59 | Source: Ambulatory Visit | Attending: Orthopedic Surgery | Admitting: Orthopedic Surgery

## 2020-12-11 DIAGNOSIS — Z01812 Encounter for preprocedural laboratory examination: Secondary | ICD-10-CM | POA: Insufficient documentation

## 2020-12-11 DIAGNOSIS — Z20822 Contact with and (suspected) exposure to covid-19: Secondary | ICD-10-CM | POA: Insufficient documentation

## 2020-12-11 HISTORY — DX: Other specified postprocedural states: Z98.890

## 2020-12-11 HISTORY — DX: Nausea with vomiting, unspecified: R11.2

## 2020-12-11 HISTORY — DX: Other complications of anesthesia, initial encounter: T88.59XA

## 2020-12-11 LAB — CBC
HCT: 43.4 % (ref 39.0–52.0)
Hemoglobin: 14.1 g/dL (ref 13.0–17.0)
MCH: 28.3 pg (ref 26.0–34.0)
MCHC: 32.5 g/dL (ref 30.0–36.0)
MCV: 87 fL (ref 80.0–100.0)
Platelets: 234 10*3/uL (ref 150–400)
RBC: 4.99 MIL/uL (ref 4.22–5.81)
RDW: 13.4 % (ref 11.5–15.5)
WBC: 7.2 10*3/uL (ref 4.0–10.5)
nRBC: 0 % (ref 0.0–0.2)

## 2020-12-11 NOTE — Progress Notes (Signed)
COVID Vaccine Completed:Yes Date COVID Vaccine completed:Feb. 2021 COVID vaccine manufacturer: Pfizer      PCP - Dr. Aviva Signs Cardiologist -   Chest x-ray - no EKG - no Stress Test - no ECHO - no Cardiac Cath - no Pacemaker/ICD device last checked:NA  Sleep Study - no CPAP -   Fasting Blood Sugar - NA Checks Blood Sugar _____ times a day  Blood Thinner Instructions:no Aspirin Instructions: Last Dose:  Anesthesia review:   Patient denies shortness of breath, fever, cough and chest pain at PAT appointment yes   Patient verbalized understanding of instructions that were given to them at the PAT appointment. Patient was also instructed that they will need to review over the PAT instructions again at home before surgery. Yes Pt has no SOB with activities. He works out regularly

## 2020-12-12 LAB — SARS CORONAVIRUS 2 (TAT 6-24 HRS): SARS Coronavirus 2: NEGATIVE

## 2020-12-14 ENCOUNTER — Ambulatory Visit (HOSPITAL_COMMUNITY): Payer: 59 | Admitting: Certified Registered Nurse Anesthetist

## 2020-12-14 ENCOUNTER — Encounter (HOSPITAL_COMMUNITY): Payer: Self-pay | Admitting: Orthopedic Surgery

## 2020-12-14 ENCOUNTER — Encounter (HOSPITAL_COMMUNITY)
Admission: RE | Disposition: A | Discharge status: Home / Self Care | Payer: Self-pay | Source: Home / Self Care | Attending: Orthopedic Surgery

## 2020-12-14 ENCOUNTER — Ambulatory Visit (HOSPITAL_COMMUNITY)
Admission: RE | Admit: 2020-12-14 | Discharge: 2020-12-14 | Disposition: A | Discharge status: Home / Self Care | Payer: 59 | Attending: Orthopedic Surgery | Admitting: Orthopedic Surgery

## 2020-12-14 ENCOUNTER — Other Ambulatory Visit: Payer: Self-pay

## 2020-12-14 DIAGNOSIS — G8929 Other chronic pain: Secondary | ICD-10-CM | POA: Diagnosis not present

## 2020-12-14 DIAGNOSIS — M75111 Incomplete rotator cuff tear or rupture of right shoulder, not specified as traumatic: Secondary | ICD-10-CM | POA: Insufficient documentation

## 2020-12-14 DIAGNOSIS — M25811 Other specified joint disorders, right shoulder: Secondary | ICD-10-CM | POA: Diagnosis not present

## 2020-12-14 HISTORY — PX: SHOULDER ARTHROSCOPY WITH ROTATOR CUFF REPAIR: SHX5685

## 2020-12-14 SURGERY — ARTHROSCOPY, SHOULDER, WITH ROTATOR CUFF REPAIR
Anesthesia: Regional | Laterality: Right

## 2020-12-14 MED ORDER — ORAL CARE MOUTH RINSE: 15.0000 mL | Freq: Once | OROMUCOSAL | Status: AC

## 2020-12-14 MED ORDER — OXYCODONE-ACETAMINOPHEN 5-325 MG PO TABS: 1.0000 | ORAL_TABLET | ORAL | 0 refills | Status: DC | PRN

## 2020-12-14 MED ORDER — FENTANYL CITRATE (PF) 100 MCG/2ML IJ SOLN
INTRAMUSCULAR | Status: AC
  Filled 2020-12-14: qty 2

## 2020-12-14 MED ORDER — GLYCOPYRROLATE PF 0.2 MG/ML IJ SOSY
PREFILLED_SYRINGE | INTRAMUSCULAR | Status: DC | PRN
  Administered 2020-12-14: .2 mg via INTRAVENOUS

## 2020-12-14 MED ORDER — FENTANYL CITRATE (PF) 100 MCG/2ML IJ SOLN
50.0000 ug | INTRAMUSCULAR | Status: DC
  Administered 2020-12-14: 100 ug via INTRAVENOUS
  Filled 2020-12-14: qty 2

## 2020-12-14 MED ORDER — DIPHENHYDRAMINE HCL 50 MG/ML IJ SOLN
INTRAMUSCULAR | Status: AC
  Filled 2020-12-14: qty 1

## 2020-12-14 MED ORDER — ROCURONIUM BROMIDE 100 MG/10ML IV SOLN
INTRAVENOUS | Status: DC | PRN
  Administered 2020-12-14: 70 mg via INTRAVENOUS
  Administered 2020-12-14: 10 mg via INTRAVENOUS
  Administered 2020-12-14: 20 mg via INTRAVENOUS

## 2020-12-14 MED ORDER — CHLORHEXIDINE GLUCONATE 0.12 % MT SOLN
15.0000 mL | Freq: Once | OROMUCOSAL | Status: AC
  Administered 2020-12-14: 15 mL via OROMUCOSAL

## 2020-12-14 MED ORDER — PHENYLEPHRINE HCL-NACL 10-0.9 MG/250ML-% IV SOLN
INTRAVENOUS | Status: DC | PRN
  Administered 2020-12-14: 40 ug/min via INTRAVENOUS

## 2020-12-14 MED ORDER — FENTANYL CITRATE (PF) 100 MCG/2ML IJ SOLN: 50.0000 ug | INTRAMUSCULAR | Status: DC

## 2020-12-14 MED ORDER — DEXAMETHASONE SODIUM PHOSPHATE 10 MG/ML IJ SOLN
INTRAMUSCULAR | Status: AC
  Filled 2020-12-14: qty 1

## 2020-12-14 MED ORDER — MIDAZOLAM HCL 2 MG/2ML IJ SOLN
1.0000 mg | INTRAMUSCULAR | Status: DC
  Administered 2020-12-14: 2 mg via INTRAVENOUS
  Filled 2020-12-14: qty 2

## 2020-12-14 MED ORDER — PROPOFOL 10 MG/ML IV BOLUS
INTRAVENOUS | Status: DC | PRN
  Administered 2020-12-14: 180 mg via INTRAVENOUS

## 2020-12-14 MED ORDER — ONDANSETRON HCL 4 MG/2ML IJ SOLN
INTRAMUSCULAR | Status: AC
  Filled 2020-12-14: qty 2

## 2020-12-14 MED ORDER — DIPHENHYDRAMINE HCL 50 MG/ML IJ SOLN
INTRAMUSCULAR | Status: DC | PRN
  Administered 2020-12-14: 12.5 mg via INTRAVENOUS

## 2020-12-14 MED ORDER — ROCURONIUM BROMIDE 10 MG/ML (PF) SYRINGE
PREFILLED_SYRINGE | INTRAVENOUS | Status: AC
  Filled 2020-12-14: qty 10

## 2020-12-14 MED ORDER — ONDANSETRON HCL 4 MG/2ML IJ SOLN: 4.0000 mg | Freq: Once | INTRAMUSCULAR | Status: DC | PRN

## 2020-12-14 MED ORDER — PHENYLEPHRINE HCL (PRESSORS) 10 MG/ML IV SOLN
INTRAVENOUS | Status: AC
  Filled 2020-12-14: qty 1

## 2020-12-14 MED ORDER — PROPOFOL 10 MG/ML IV BOLUS
INTRAVENOUS | Status: AC
  Filled 2020-12-14: qty 20

## 2020-12-14 MED ORDER — SODIUM CHLORIDE 0.9 % IR SOLN
Status: DC | PRN
  Administered 2020-12-14 (×2): 3000 mL

## 2020-12-14 MED ORDER — PHENYLEPHRINE 40 MCG/ML (10ML) SYRINGE FOR IV PUSH (FOR BLOOD PRESSURE SUPPORT)
PREFILLED_SYRINGE | INTRAVENOUS | Status: AC
  Filled 2020-12-14: qty 10

## 2020-12-14 MED ORDER — NAPROXEN 500 MG PO TABS: 500.0000 mg | ORAL_TABLET | Freq: Two times a day (BID) | ORAL | 1 refills | Status: DC

## 2020-12-14 MED ORDER — HYDROMORPHONE HCL 1 MG/ML IJ SOLN
INTRAMUSCULAR | Status: AC
  Filled 2020-12-14: qty 1

## 2020-12-14 MED ORDER — HYDROMORPHONE HCL 1 MG/ML IJ SOLN
0.2500 mg | INTRAMUSCULAR | Status: DC | PRN
Start: 2020-12-14 — End: 2020-12-14
  Administered 2020-12-14: 0.5 mg via INTRAVENOUS

## 2020-12-14 MED ORDER — ACETAMINOPHEN 10 MG/ML IV SOLN: 1000.0000 mg | Freq: Once | INTRAVENOUS | Status: DC | PRN

## 2020-12-14 MED ORDER — ONDANSETRON HCL 4 MG/2ML IJ SOLN
INTRAMUSCULAR | Status: DC | PRN
  Administered 2020-12-14: 4 mg via INTRAVENOUS

## 2020-12-14 MED ORDER — FENTANYL CITRATE (PF) 100 MCG/2ML IJ SOLN
INTRAMUSCULAR | Status: DC | PRN
  Administered 2020-12-14: 100 ug via INTRAVENOUS

## 2020-12-14 MED ORDER — MIDAZOLAM HCL 2 MG/2ML IJ SOLN: 1.0000 mg | INTRAMUSCULAR | Status: DC

## 2020-12-14 MED ORDER — CYCLOBENZAPRINE HCL 10 MG PO TABS: 10.0000 mg | ORAL_TABLET | Freq: Three times a day (TID) | ORAL | 1 refills | Status: DC | PRN

## 2020-12-14 MED ORDER — BUPIVACAINE HCL (PF) 0.5 % IJ SOLN
INTRAMUSCULAR | Status: DC | PRN
  Administered 2020-12-14: 15 mL via PERINEURAL

## 2020-12-14 MED ORDER — CEFAZOLIN SODIUM-DEXTROSE 2-4 GM/100ML-% IV SOLN
2.0000 g | INTRAVENOUS | Status: AC
  Administered 2020-12-14: 2 g via INTRAVENOUS
  Filled 2020-12-14: qty 100

## 2020-12-14 MED ORDER — BUPIVACAINE LIPOSOME 1.3 % IJ SUSP
INTRAMUSCULAR | Status: DC | PRN
Start: 2020-12-14 — End: 2020-12-14
  Administered 2020-12-14: 10 mL via PERINEURAL

## 2020-12-14 MED ORDER — LACTATED RINGERS IV SOLN: INTRAVENOUS | Status: DC

## 2020-12-14 MED ORDER — SUGAMMADEX SODIUM 200 MG/2ML IV SOLN
INTRAVENOUS | Status: DC | PRN
  Administered 2020-12-14: 400 mg via INTRAVENOUS

## 2020-12-14 MED ORDER — ONDANSETRON HCL 4 MG PO TABS: 4.0000 mg | ORAL_TABLET | Freq: Three times a day (TID) | ORAL | 0 refills | Status: DC | PRN

## 2020-12-14 MED ORDER — 0.9 % SODIUM CHLORIDE (POUR BTL) OPTIME
TOPICAL | Status: DC | PRN
  Administered 2020-12-14: 1000 mL

## 2020-12-14 SURGICAL SUPPLY — 74 items
ADH SKN CLS APL DERMABOND .7 (GAUZE/BANDAGES/DRESSINGS) ×1
ANCH SUT SWLK 19.1X6.25 CLS (Anchor) ×1 IMPLANT
ANCHOR SUT SWIVELLOK BIO (Anchor) ×1 IMPLANT
BLADE EXCALIBUR 4.0X13 (MISCELLANEOUS) ×2 IMPLANT
BLADE SURG SZ10 CARB STEEL (BLADE) ×1 IMPLANT
BOOTIES KNEE HIGH SLOAN (MISCELLANEOUS) ×4 IMPLANT
BURR OVAL 8 FLU 4.0X13 (MISCELLANEOUS) ×2 IMPLANT
BURR OVAL 8 FLU 5.0X13 (MISCELLANEOUS) ×2 IMPLANT
CANNULA ACUFLEX KIT 5X76 (CANNULA) ×2 IMPLANT
CANNULA DRILOCK 5.0X75 (CANNULA) IMPLANT
CANNULA TWIST IN 8.25X7CM (CANNULA) IMPLANT
CONNECTOR 5 IN 1 STRAIGHT STRL (MISCELLANEOUS) ×2 IMPLANT
COOLER ICEMAN CLASSIC (MISCELLANEOUS) IMPLANT
COVER WAND RF STERILE (DRAPES) ×2 IMPLANT
DERMABOND ADVANCED (GAUZE/BANDAGES/DRESSINGS) ×1
DERMABOND ADVANCED .7 DNX12 (GAUZE/BANDAGES/DRESSINGS) IMPLANT
DISSECTOR  3.8MM X 13CM (MISCELLANEOUS) ×2
DISSECTOR 3.8MM X 13CM (MISCELLANEOUS) ×1 IMPLANT
DRAPE INCISE 23X17 IOBAN STRL (DRAPES) ×1
DRAPE INCISE 23X17 STRL (DRAPES) ×1 IMPLANT
DRAPE INCISE IOBAN 23X17 STRL (DRAPES) ×1 IMPLANT
DRAPE INCISE IOBAN 66X45 STRL (DRAPES) ×3 IMPLANT
DRAPE ORTHO SPLIT 77X108 STRL (DRAPES)
DRAPE STERI 35X30 U-POUCH (DRAPES) ×2 IMPLANT
DRAPE SURG 17X11 SM STRL (DRAPES) ×2 IMPLANT
DRAPE SURG ORHT 6 SPLT 77X108 (DRAPES) IMPLANT
DRAPE U-SHAPE 47X51 STRL (DRAPES) ×2 IMPLANT
DRESSING AQUACEL AG SP 3.5X4 (GAUZE/BANDAGES/DRESSINGS) IMPLANT
DRSG AQUACEL AG SP 3.5X4 (GAUZE/BANDAGES/DRESSINGS) ×2
DRSG PAD ABDOMINAL 8X10 ST (GAUZE/BANDAGES/DRESSINGS) ×4 IMPLANT
DURAPREP 26ML APPLICATOR (WOUND CARE) ×2 IMPLANT
Disposable Instrument Kit for Tendonesis screw ×1 IMPLANT
ELECT PENCIL ROCKER SW 15FT (MISCELLANEOUS) ×1 IMPLANT
GAUZE SPONGE 4X4 12PLY STRL (GAUZE/BANDAGES/DRESSINGS) ×2 IMPLANT
GLOVE BIO SURGEON STRL SZ7.5 (GLOVE) ×2 IMPLANT
GLOVE BIO SURGEON STRL SZ8 (GLOVE) ×2 IMPLANT
GLOVE SS BIOGEL STRL SZ 7 (GLOVE) ×2 IMPLANT
GLOVE SS BIOGEL STRL SZ 7.5 (GLOVE) ×1 IMPLANT
GLOVE SUPERSENSE BIOGEL SZ 7 (GLOVE) ×2
GLOVE SUPERSENSE BIOGEL SZ 7.5 (GLOVE) ×1
GOWN STRL REUS W/TWL LRG LVL3 (GOWN DISPOSABLE) ×4 IMPLANT
KIT BASIN OR (CUSTOM PROCEDURE TRAY) ×2 IMPLANT
KIT BIO-TENODESIS 3X8 DISP (MISCELLANEOUS) ×2
KIT INSRT BABSR STRL DISP BTN (MISCELLANEOUS) IMPLANT
KIT SHOULDER TRACTION (DRAPES) ×2 IMPLANT
KIT TURNOVER KIT A (KITS) IMPLANT
MANIFOLD NEPTUNE II (INSTRUMENTS) ×2 IMPLANT
NDL SCORPION MULTI FIRE (NEEDLE) IMPLANT
NEEDLE SCORPION MULTI FIRE (NEEDLE) IMPLANT
NS IRRIG 1000ML POUR BTL (IV SOLUTION) ×2 IMPLANT
PACK ARTHROSCOPY WL (CUSTOM PROCEDURE TRAY) ×2 IMPLANT
PAD ARMBOARD 7.5X6 YLW CONV (MISCELLANEOUS) ×2 IMPLANT
PAD COLD SHLDR WRAP-ON (PAD) IMPLANT
PROBE APOLLO 90XL (SURGICAL WAND) ×2 IMPLANT
SLING ARM FOAM STRAP LRG (SOFTGOODS) ×1 IMPLANT
SLING ARM FOAM STRAP MED (SOFTGOODS) IMPLANT
SPONGE LAP 4X18 RFD (DISPOSABLE) ×2 IMPLANT
STRIP CLOSURE SKIN 1/2X4 (GAUZE/BANDAGES/DRESSINGS) ×2 IMPLANT
SUCTION FRAZIER HANDLE 10FR (MISCELLANEOUS) ×2
SUCTION TUBE FRAZIER 10FR DISP (MISCELLANEOUS) IMPLANT
SUT FIBERWIRE #2 38 T-5 BLUE (SUTURE)
SUT MNCRL AB 3-0 PS2 18 (SUTURE) ×2 IMPLANT
SUT MON AB 2-0 CT1 36 (SUTURE) ×1 IMPLANT
SUT PDS AB 0 CT 36 (SUTURE) ×1 IMPLANT
SUT PDS AB 1 CTX 36 (SUTURE) ×1 IMPLANT
SUT TIGER TAPE 7 IN WHITE (SUTURE) ×2 IMPLANT
SUTURE FIBERWR #2 38 T-5 BLUE (SUTURE) IMPLANT
SYR BULB IRRIG 60ML STRL (SYRINGE) ×1 IMPLANT
TAPE FIBER 2MM 7IN #2 BLUE (SUTURE) IMPLANT
TAPE PAPER 3X10 WHT MICROPORE (GAUZE/BANDAGES/DRESSINGS) ×2 IMPLANT
TOWEL OR 17X26 10 PK STRL BLUE (TOWEL DISPOSABLE) ×2 IMPLANT
TUBING ARTHROSCOPY IRRIG 16FT (MISCELLANEOUS) ×2 IMPLANT
WATER STERILE IRR 1000ML POUR (IV SOLUTION) ×2 IMPLANT
YANKAUER SUCT BULB TIP NO VENT (SUCTIONS) ×1 IMPLANT

## 2020-12-14 NOTE — Anesthesia Procedure Notes (Signed)
Procedure Name: Intubation Performed by: Rosaland Lao, CRNA Pre-anesthesia Checklist: Patient identified, Emergency Drugs available, Suction available and Patient being monitored Patient Re-evaluated:Patient Re-evaluated prior to induction Oxygen Delivery Method: Circle system utilized Preoxygenation: Pre-oxygenation with 100% oxygen Induction Type: IV induction Ventilation: Mask ventilation without difficulty Laryngoscope Size: Mac and 4 Grade View: Grade I Tube type: Oral Number of attempts: 1 Airway Equipment and Method: Stylet Placement Confirmation: ETT inserted through vocal cords under direct vision,  positive ETCO2 and breath sounds checked- equal and bilateral Secured at: 22 cm Tube secured with: Tape Dental Injury: Teeth and Oropharynx as per pre-operative assessment

## 2020-12-14 NOTE — Op Note (Signed)
12/14/2020  2:37 PM  PATIENT:   Stephen Arellano  54 y.o. male  PRE-OPERATIVE DIAGNOSIS:  Right shoulder impingement, partial rotator cuff tear, bicep dislocation  POST-OPERATIVE DIAGNOSIS: Same with additional finding of synovitis and degenerative labral tear  PROCEDURE:  1.  Right shoulder examination under anesthesia.  2.  Right shoulder glenohumeral joint diagnostic arthroscopy  3.  Labral debridement and limited synovectomy  4.  Arthroscopic subacromial decompression and bursectomy  5.  Open subpectoral biceps tendon tenodesis  SURGEON:  Khylie Larmore, Metta Clines. M.D.  ASSISTANTS: Jenetta Loges, PA-C  ANESTHESIA:   General endotracheal and interscalene block with Exparel  EBL: Minimal  SPECIMEN: None  Drains: None   PATIENT DISPOSITION:  PACU - hemodynamically stable.    PLAN OF CARE: Discharge to home after PACU  Brief history:  Patient is a 54 year old male who has had chronic activity related right shoulder pain with clinical examination consistent with bicipital tendon pathologies.  He does have a remote history of an AC separation treated with an AC joint reconstruction.  His current x-rays show evidence for prior distal clavicle resection.  Recent MRI scan shows medial dislocation of the long head biceps tendon through tear in the upper border of the subscapularis felt to be partial thickness.  Due to his ongoing pain and function limitations and failure to respond to conservative management he is brought to the operating this time for planned right shoulder arthroscopy as described below.  Preoperatively patient was counseled regarding treatment options as well as the potential risks versus benefits there.  Possible surgical complications were reviewed including bleeding, infection, neurovascular injury, persistent pain, loss of motion, anesthetic complication, loss of fixation of the biceps tenodesis, and possible need for additional surgery.  He understands, and  accepts, and agrees with our planned procedure.  Procedure in detail:  After undergoing routine preop evaluation the patient received prophylactic antibiotics and interscalene block with Exparel was established in the holding area by the anesthesia department.  Patient was subsequently placed supine on the operating table and underwent a selection of a general endotracheal anesthesia.  Turn to the left lateral decubitus position on a beanbag and appropriately padded and protected.  Right shoulder examination under anesthesia revealed full motion and no instability patterns were noted.  The right arm was then suspended at 70 degrees of abduction with 15 pounds of traction and the right shoulder girdle region was sterilely prepped and draped in standard fashion.  A timeout was called.  A posterior portal was established and glenohumeral joint and an anterior portal established under direct visualization.  Moderate synovitis was noted anteriorly and a limited synovectomy was performed.  Overall the capsular volume was within normal limits.  There was evidence for some chondromalacia on the humeral head and a focal area approximately 1.5cm in diameter on the posterior superior aspect of the humeral head which appear to be a chronic there was degenerative tearing of the anterior superior labrum which was debrided with a shaver back to healthy and stable margin.  The biceps tendon showed significant partial tearing distally with subluxation into the joint through a partial tear of the subscapularis.  With this finding the long head biceps tendon was intact with a PDS suture and then divided for later tenodesis.  Rotator cuff showed a partial articular tear of the distal supraspinatus and this was debrided with a shaver back to healthy tissue and a tag suture of 0 PDS was passed through the apex of the partial tear which  I would estimated perhaps 20 to 30% of the tendon thickness.  Remaining inspection of the  glenohumeral joint showed no additional pathologies.  Fluid analysis removed.  The arm was then dropped down to 30 degrees of abduction with the arthroscope introduced in the subacromial space of the posterior portal and a direct lateral portal established in the subacromial space.  Abundant dense bursal tissue multiple adhesions were encountered and extensive bursectomy was completed with the shaver and the Arthrex wand.  The wand was then used to remove the periosteum from the undersurface of the anterior half of the acromion and subacromial decompression was performed with a bur chronic type I morphology.  We then carefully inspected the bursal surface of the rotator cuff and probe particularly in the area around the tag suture.  Overall good quality and caliber of the tendon was encountered and so we did not find any areas to suggest a defect that would require repair.  With this the tag suture was removed.  The bursectomy was completed.  Hemostasis was obtained.  Fluid and instruments were removed.  Attention then directed to the anterior aspect of the upper arm and a 3 cm incision was made at the level of the lower border of the pectoralis major.  Dissection carried deeply with the pectoralis major retracted proximally and laterally.  The anterior cortex of the humerus was then identified with the long head biceps tendon then retracted up through the incision.  A whipstitch of #2 FiberWire was then passed through the approximately 3 cm of tendon just proximal to the musculotendinous junction. A guidepin was then directed into the anterior humeral cortex at the appropriate level for a tenodesis tunnel.  This was then drilled with a 7.5 reamer.  A 6.25 biointerference screw was then used to sinker tendon into the bone tunnel and the screw was then inserted with excellent fit and fixation.  The suture limbs were then oversewn.  At the completion we were pleased with the overall stability as well as tension on  the bicep.  The wound was irrigated.  2-0 Monocryl used for subcu layer closure and intracuticular 3-0 Monocryl for the skin followed by Dermabond and OpSite dressing.  The proximal portals are closed with Monocryl and a Steri-Strip.  A bulky dry dressing taped about the right shoulder and the right arm was placed into a sling.  Patient was awakened, extubated, and taken to the recovery room in stable condition.  Jenetta Loges, PA-C was utilized as an Environmental consultant throughout this case, essential for help with positioning the patient, positioning extremity, tissue manipulation, implantation of the prosthesis, suture management, wound closure, and intraoperative decision-making.  Marin Shutter MD   Contact # [phone]

## 2020-12-14 NOTE — Transfer of Care (Signed)
Immediate Anesthesia Transfer of Care Note  Patient: Stephen Arellano  Procedure(s) Performed: Right shoulder arthroscopy, debridement, rotator cuff repair, open subpectoral bicep tenodesis (Right )  Patient Location: PACU  Anesthesia Type:General  Level of Consciousness: awake, alert  and oriented  Airway & Oxygen Therapy: Patient Spontanous Breathing and Patient connected to face mask  Post-op Assessment: Report given to RN and Post -op Vital signs reviewed and stable  Post vital signs: Reviewed and stable  Last Vitals:  Vitals Value Taken Time  BP 128/85 12/14/20 1500  Temp 36.4 C 12/14/20 1455  Pulse 68 12/14/20 1504  Resp 14 12/14/20 1504  SpO2 100 % 12/14/20 1504  Vitals shown include unvalidated device data.  Last Pain:  Vitals:   12/14/20 1455  TempSrc:   PainSc: 0-No pain      Patients Stated Pain Goal: 4 (48/68/85 2074)  Complications: No complications documented.

## 2020-12-14 NOTE — Progress Notes (Signed)
AssistedDr. Jana Half with right, ultrasound guided, interscalene  block. Side rails up, monitors on throughout procedure. See vital signs in flow sheet. Tolerated Procedure well.

## 2020-12-14 NOTE — Anesthesia Preprocedure Evaluation (Signed)
Anesthesia Evaluation  Patient identified by MRN, date of birth, ID band Patient awake    Reviewed: NPO status , Patient's Chart, lab work & pertinent test results  History of Anesthesia Complications (+) PONV  Airway Mallampati: II  TM Distance: >3 FB Neck ROM: Full    Dental  (+) Teeth Intact   Pulmonary neg pulmonary ROS,    Pulmonary exam normal        Cardiovascular negative cardio ROS   Rhythm:Regular Rate:Normal     Neuro/Psych negative neurological ROS  negative psych ROS   GI/Hepatic negative GI ROS, Neg liver ROS,   Endo/Other  negative endocrine ROS  Renal/GU negative Renal ROS  negative genitourinary   Musculoskeletal  (+) Arthritis , Osteoarthritis,    Abdominal (+)  Abdomen: soft. Bowel sounds: normal.  Peds  Hematology negative hematology ROS (+)   Anesthesia Other Findings   Reproductive/Obstetrics                             Anesthesia Physical Anesthesia Plan  ASA: II  Anesthesia Plan: General and Regional   Post-op Pain Management: GA combined w/ Regional for post-op pain   Induction: Intravenous  PONV Risk Score and Plan: 3 and Ondansetron, Dexamethasone, Midazolam and Treatment may vary due to age or medical condition  Airway Management Planned: Mask and Oral ETT  Additional Equipment: None  Intra-op Plan:   Post-operative Plan: Extubation in OR  Informed Consent: I have reviewed the patients History and Physical, chart, labs and discussed the procedure including the risks, benefits and alternatives for the proposed anesthesia with the patient or authorized representative who has indicated his/her understanding and acceptance.     Dental advisory given  Plan Discussed with: CRNA  Anesthesia Plan Comments: (Lab Results      Component                Value               Date                      WBC                      7.2                  12/11/2020                HGB                      14.1                12/11/2020                HCT                      43.4                12/11/2020                MCV                      87.0                12/11/2020                PLT  234                 12/11/2020           )        Anesthesia Quick Evaluation
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
## 2020-12-14 NOTE — Anesthesia Procedure Notes (Signed)
Anesthesia Regional Block: Interscalene brachial plexus block   Pre-Anesthetic Checklist: ,, timeout performed, Correct Patient, Correct Site, Correct Laterality, Correct Procedure, Correct Position, site marked, Risks and benefits discussed,  Surgical consent,  Pre-op evaluation,  At surgeon's request and post-op pain management  Laterality: Right  Prep: Dura Prep       Needles:  Injection technique: Single-shot  Needle Type: Echogenic Stimulator Needle     Needle Length: 5cm  Needle Gauge: 20     Additional Needles:   Procedures:,,,, ultrasound used (permanent image in chart),,,,  Narrative:  Start time: 12/14/2020 11:50 AM End time: 12/14/2020 11:54 AM Injection made incrementally with aspirations every 5 mL.  Performed by: Personally  Anesthesiologist: Darral Dash, DO  Additional Notes: Patient identified. Risks/Benefits/Options discussed with patient including but not limited to bleeding, infection, nerve damage, failed block, incomplete pain control. Patient expressed understanding and wished to proceed. All questions were answered. Sterile technique was used throughout the entire procedure. Please see nursing notes for vital signs. Aspirated in 5cc intervals with injection for negative confirmation. Patient was given instructions on fall risk and not to get out of bed. All questions and concerns addressed with instructions to call with any issues or inadequate analgesia.

## 2020-12-14 NOTE — Discharge Instructions (Signed)
   Stephen Arellano. Supple, M.D., F.A.A.O.S. Orthopaedic Surgery Specializing in Arthroscopic and Reconstructive Surgery of the Shoulder [phone] [address] [place], [place] [address] - Fax [phone]   POST-OP SHOULDER ARTHROSCOPY and BICEPS TENODESIS INSTRUCTIONS  1. Call the office at [phone] to schedule your first post-op appointment 7-10 days from the date of your surgery.  2. Leave the steri-strips in place over your incisions when performing dressing changes and showering. You may remove your dressings and begin showering 72 hours from surgery. You can expect drainage that is clear to bloody in nature that occasionally will soak through your dressings. If this occurs go ahead and perform a dressing change. The drainage should lessen daily and when there is no drainage from your incisions feel free to go without a dressing.  There is a separate bandage over the open incision for the biceps repair, it is waterproof. Leave that dressing on until your follow up appointment in the office.   3. Wear your sling for comfort. You may come out of your sling for hygiene and to rest arm in position of comfort in controlled environment. You should sleep in sling and wear it when up and around otherwise. TRY TO KEEP ELBOW SLIGHTLY BENT AT ALL TIMES BECAUSE OF BICEPS REPAIR!!!  4. Range of motion to your wrist and hand are encouraged 3-5 times daily. Exercise to your hand and fingers helps to reduce swelling you may experience.  5. Utilize ice to the shoulder 3-4 times minimum a day and additionally if you are experiencing pain.  6. You may drive when safely off narcotics and muscle relaxants.  7.Pain control following an exparel block  To help control your post-operative pain you received a nerve block  performed with Exparel which is a long acting anesthetic (numbing agent) which can provide pain relief and sensations of numbness (and relief of pain) in the operative shoulder  and arm for up to 3 days. Sometimes it provides mixed relief, meaning you may still have numbness in certain areas of the arm but can still be able to move  parts of that arm, hand, and fingers. We recommend that your prescribed pain medications  be used as needed. We do not feel it is necessary to "pre medicate" and "stay ahead" of pain.  Taking narcotic pain medications when you are not having any pain can lead to unnecessary and potentially dangerous side effects.    8. Pain medications can produce constipation along with their use. If you experience this, the use of an over the counter stool softener or laxative daily is recommended.   9. For additional questions or concerns, please do not hesitate to call the office. If after hours there is an answering service to forward your concerns to the physician on call.   POST-OP EXERCISES  The pendulum exercises should be performed while bending at the waist as far over as possible thereby letting gravity do the work for you.  Range of Motion Exercises: Pendulum (circular)  Repeat 20 times. Do 3 sessions per day.   TRY TO KEEP ELBOW SLIGHTLY BENT AT ALL TIMES BECAUSE OF BICEPS REPAIR!!!    Range of Motion Exercises: Pendulum (side-to-side)  Repeat 20 times. Do 3 sessions per day.

## 2020-12-15 NOTE — Anesthesia Postprocedure Evaluation (Signed)
Anesthesia Post Note  Patient: Stephen Arellano  Procedure(s) Performed: Right shoulder arthroscopy, debridement, rotator cuff repair, open subpectoral bicep tenodesis (Right )     Patient location during evaluation: PACU Anesthesia Type: Regional and General Level of consciousness: awake and alert Pain management: pain level controlled Vital Signs Assessment: post-procedure vital signs reviewed and stable Respiratory status: spontaneous breathing, nonlabored ventilation, respiratory function stable and patient connected to nasal cannula oxygen Cardiovascular status: blood pressure returned to baseline and stable Postop Assessment: no apparent nausea or vomiting Anesthetic complications: no   No complications documented.  Last Vitals:  Vitals:   12/14/20 1600 12/14/20 1630  BP: (!) 134/94 122/89  Pulse: (!) 51 (!) 53  Resp: 16 16  Temp:    SpO2: 100% 100%    Last Pain:  Vitals:   12/14/20 1630  TempSrc:   PainSc: 1                  Andrews Tener P Corazon Nickolas

## 2020-12-18 ENCOUNTER — Encounter (HOSPITAL_COMMUNITY): Payer: Self-pay | Admitting: Orthopedic Surgery

## 2021-11-26 ENCOUNTER — Encounter (HOSPITAL_BASED_OUTPATIENT_CLINIC_OR_DEPARTMENT_OTHER): Payer: Self-pay | Admitting: Orthopedic Surgery
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
## 2021-12-03 ENCOUNTER — Encounter (HOSPITAL_BASED_OUTPATIENT_CLINIC_OR_DEPARTMENT_OTHER): Payer: Self-pay | Admitting: Orthopedic Surgery

## 2021-12-03 ENCOUNTER — Encounter (HOSPITAL_BASED_OUTPATIENT_CLINIC_OR_DEPARTMENT_OTHER)
Admission: RE | Disposition: A | Discharge status: Home / Self Care | Payer: Self-pay | Source: Ambulatory Visit | Attending: Orthopedic Surgery

## 2021-12-03 ENCOUNTER — Ambulatory Visit (HOSPITAL_BASED_OUTPATIENT_CLINIC_OR_DEPARTMENT_OTHER)
Admission: RE | Admit: 2021-12-03 | Discharge: 2021-12-03 | Disposition: A | Discharge status: Home / Self Care | Payer: 59 | Source: Ambulatory Visit | Attending: Orthopedic Surgery | Admitting: Orthopedic Surgery

## 2021-12-03 ENCOUNTER — Ambulatory Visit (HOSPITAL_BASED_OUTPATIENT_CLINIC_OR_DEPARTMENT_OTHER): Payer: 59 | Admitting: Certified Registered"

## 2021-12-03 ENCOUNTER — Other Ambulatory Visit: Payer: Self-pay

## 2021-12-03 ENCOUNTER — Ambulatory Visit (HOSPITAL_BASED_OUTPATIENT_CLINIC_OR_DEPARTMENT_OTHER): Payer: 59

## 2021-12-03 DIAGNOSIS — S63630A Sprain of interphalangeal joint of right index finger, initial encounter: Secondary | ICD-10-CM | POA: Insufficient documentation

## 2021-12-03 HISTORY — PX: ULNAR COLLATERAL LIGAMENT REPAIR: SHX6159

## 2021-12-03 SURGERY — REPAIR, LIGAMENT, ULNAR COLLATERAL
Anesthesia: Monitor Anesthesia Care | Site: Finger | Laterality: Right

## 2021-12-03 MED ORDER — ACETAMINOPHEN 500 MG PO TABS
ORAL_TABLET | ORAL | Status: AC
  Filled 2021-12-03: qty 1

## 2021-12-03 MED ORDER — FENTANYL CITRATE (PF) 100 MCG/2ML IJ SOLN
100.0000 ug | Freq: Once | INTRAMUSCULAR | Status: AC
  Administered 2021-12-03: 50 ug via INTRAVENOUS

## 2021-12-03 MED ORDER — MIDAZOLAM HCL 2 MG/2ML IJ SOLN
2.0000 mg | Freq: Once | INTRAMUSCULAR | Status: AC
  Administered 2021-12-03: 2 mg via INTRAVENOUS

## 2021-12-03 MED ORDER — MIDAZOLAM HCL 2 MG/2ML IJ SOLN
INTRAMUSCULAR | Status: AC
  Filled 2021-12-03: qty 2

## 2021-12-03 MED ORDER — ACETAMINOPHEN 500 MG PO TABS
1000.0000 mg | ORAL_TABLET | Freq: Once | ORAL | Status: AC
  Administered 2021-12-03: 1000 mg via ORAL

## 2021-12-03 MED ORDER — PROPOFOL 500 MG/50ML IV EMUL
INTRAVENOUS | Status: DC | PRN
  Administered 2021-12-03: 100 ug/kg/min via INTRAVENOUS

## 2021-12-03 MED ORDER — LIDOCAINE HCL (CARDIAC) PF 100 MG/5ML IV SOSY
PREFILLED_SYRINGE | INTRAVENOUS | Status: DC | PRN
  Administered 2021-12-03: 80 mg via INTRAVENOUS

## 2021-12-03 MED ORDER — BUPIVACAINE-EPINEPHRINE (PF) 0.5% -1:200000 IJ SOLN
INTRAMUSCULAR | Status: DC | PRN
  Administered 2021-12-03: 30 mL via PERINEURAL

## 2021-12-03 MED ORDER — 0.9 % SODIUM CHLORIDE (POUR BTL) OPTIME
TOPICAL | Status: DC | PRN
  Administered 2021-12-03: 200 mL

## 2021-12-03 MED ORDER — FENTANYL CITRATE (PF) 100 MCG/2ML IJ SOLN
INTRAMUSCULAR | Status: AC
  Filled 2021-12-03: qty 2

## 2021-12-03 MED ORDER — CEFAZOLIN SODIUM-DEXTROSE 2-4 GM/100ML-% IV SOLN
2.0000 g | INTRAVENOUS | Status: AC
  Administered 2021-12-03: 2 g via INTRAVENOUS

## 2021-12-03 MED ORDER — FENTANYL CITRATE (PF) 100 MCG/2ML IJ SOLN: 25.0000 ug | INTRAMUSCULAR | Status: DC | PRN

## 2021-12-03 MED ORDER — ONDANSETRON HCL 4 MG/2ML IJ SOLN
INTRAMUSCULAR | Status: DC | PRN
  Administered 2021-12-03: 4 mg via INTRAVENOUS

## 2021-12-03 MED ORDER — ONDANSETRON HCL 4 MG/2ML IJ SOLN
INTRAMUSCULAR | Status: AC
  Filled 2021-12-03: qty 2

## 2021-12-03 MED ORDER — LACTATED RINGERS IV SOLN: INTRAVENOUS | Status: DC

## 2021-12-03 MED ORDER — CLONIDINE HCL (ANALGESIA) 100 MCG/ML EP SOLN
EPIDURAL | Status: DC | PRN
  Administered 2021-12-03: 100 ug

## 2021-12-03 MED ORDER — CEFAZOLIN SODIUM-DEXTROSE 2-4 GM/100ML-% IV SOLN
INTRAVENOUS | Status: AC
  Filled 2021-12-03: qty 100

## 2021-12-03 MED ORDER — PROPOFOL 10 MG/ML IV BOLUS
INTRAVENOUS | Status: DC | PRN
  Administered 2021-12-03: 30 mg via INTRAVENOUS

## 2021-12-03 MED ORDER — HYDROCODONE-ACETAMINOPHEN 5-325 MG PO TABS: 1.0000 | ORAL_TABLET | ORAL | 0 refills | Status: AC | PRN

## 2021-12-03 MED ORDER — OXYCODONE HCL 5 MG PO TABS: 5.0000 mg | ORAL_TABLET | Freq: Once | ORAL | Status: DC | PRN

## 2021-12-03 MED ORDER — PROMETHAZINE HCL 25 MG/ML IJ SOLN: 6.2500 mg | INTRAMUSCULAR | Status: DC | PRN

## 2021-12-03 MED ORDER — OXYCODONE HCL 5 MG/5ML PO SOLN: 5.0000 mg | Freq: Once | ORAL | Status: DC | PRN

## 2021-12-03 SURGICAL SUPPLY — 56 items
ANCH SUT 2-0 3/8 CRC MIC FT 4 (Anchor) ×1 IMPLANT
ANCHOR FT CORKSCREW MICRO 2-0 (Anchor) ×1 IMPLANT
BLADE SURG 15 STRL LF DISP TIS (BLADE) ×1 IMPLANT
BLADE SURG 15 STRL SS (BLADE) ×2
BNDG CMPR 9X4 STRL LF SNTH (GAUZE/BANDAGES/DRESSINGS) ×1
BNDG ELASTIC 2X5.8 VLCR STR LF (GAUZE/BANDAGES/DRESSINGS) IMPLANT
BNDG ELASTIC 3X5.8 VLCR STR LF (GAUZE/BANDAGES/DRESSINGS) IMPLANT
BNDG ELASTIC 4X5.8 VLCR STR LF (GAUZE/BANDAGES/DRESSINGS) IMPLANT
BNDG ESMARK 4X9 LF (GAUZE/BANDAGES/DRESSINGS) ×2 IMPLANT
BNDG GAUZE ELAST 4 BULKY (GAUZE/BANDAGES/DRESSINGS) IMPLANT
CORD BIPOLAR FORCEPS 12FT (ELECTRODE) ×2 IMPLANT
COVER BACK TABLE 60X90IN (DRAPES) ×2 IMPLANT
COVER MAYO STAND STRL (DRAPES) ×2 IMPLANT
DRAPE EXTREMITY T 121X128X90 (DISPOSABLE) ×2 IMPLANT
DRAPE SURG 17X23 STRL (DRAPES) ×2 IMPLANT
DRSG EMULSION OIL 3X3 NADH (GAUZE/BANDAGES/DRESSINGS) ×1 IMPLANT
GAUZE SPONGE 4X4 12PLY STRL (GAUZE/BANDAGES/DRESSINGS) ×2 IMPLANT
GLOVE SURG ENC MOIS LTX SZ6.5 (GLOVE) ×2 IMPLANT
GLOVE SURG ORTHO LTX SZ8 (GLOVE) ×2 IMPLANT
GLOVE SURG UNDER POLY LF SZ6.5 (GLOVE) ×2 IMPLANT
GLOVE SURG UNDER POLY LF SZ8.5 (GLOVE) ×2 IMPLANT
GOWN STRL REUS W/ TWL LRG LVL3 (GOWN DISPOSABLE) ×1 IMPLANT
GOWN STRL REUS W/ TWL XL LVL3 (GOWN DISPOSABLE) ×1 IMPLANT
GOWN STRL REUS W/TWL LRG LVL3 (GOWN DISPOSABLE) ×2
GOWN STRL REUS W/TWL XL LVL3 (GOWN DISPOSABLE) ×2
NDL 1/2 CIR CATGUT .05X1.09 (NEEDLE) ×1 IMPLANT
NDL HYPO 25X1 1.5 SAFETY (NEEDLE) IMPLANT
NDL MAYO 6 CRC TAPER PT (NEEDLE) IMPLANT
NDL SUT 6 .5 CRC .975X.05 MAYO (NEEDLE) IMPLANT
NEEDLE 1/2 CIR CATGUT .05X1.09 (NEEDLE) ×2 IMPLANT
NEEDLE HYPO 25X1 1.5 SAFETY (NEEDLE) IMPLANT
NEEDLE MAYO 6 CRC TAPER PT (NEEDLE) IMPLANT
NEEDLE MAYO TAPER (NEEDLE)
NS IRRIG 1000ML POUR BTL (IV SOLUTION) ×2 IMPLANT
PACK BASIN DAY SURGERY FS (CUSTOM PROCEDURE TRAY) ×2 IMPLANT
PADDING CAST ABS 4INX4YD NS (CAST SUPPLIES) ×1
PADDING CAST ABS COTTON 4X4 ST (CAST SUPPLIES) ×1 IMPLANT
SLING ARM FOAM STRAP XLG (SOFTGOODS) ×1 IMPLANT
SPLINT FIBERGLASS 3X35 (CAST SUPPLIES) IMPLANT
SPLINT FIBERGLASS 4X30 (CAST SUPPLIES) IMPLANT
SPLINT PLASTER CAST XFAST 3X15 (CAST SUPPLIES) IMPLANT
SPLINT PLASTER XTRA FASTSET 3X (CAST SUPPLIES)
STOCKINETTE 4X48 STRL (DRAPES) ×2 IMPLANT
SUCTION FRAZIER HANDLE 10FR (MISCELLANEOUS) ×2
SUCTION TUBE FRAZIER 10FR DISP (MISCELLANEOUS) IMPLANT
SUT ETHIBOND 3-0 V-5 (SUTURE) IMPLANT
SUT ETHILON 4 0 PS 2 18 (SUTURE) IMPLANT
SUT MERSILENE 4 0 P 3 (SUTURE) IMPLANT
SUT PROLENE 4 0 PS 2 18 (SUTURE) ×2 IMPLANT
SUT VIC AB 2-0 SH 27 (SUTURE)
SUT VIC AB 2-0 SH 27XBRD (SUTURE) IMPLANT
SUT VICRYL 4-0 PS2 18IN ABS (SUTURE) ×2 IMPLANT
SYR BULB EAR ULCER 3OZ GRN STR (SYRINGE) ×2 IMPLANT
SYR CONTROL 10ML LL (SYRINGE) IMPLANT
TRAY DSU PREP LF (CUSTOM PROCEDURE TRAY) ×2 IMPLANT
UNDERPAD 30X36 HEAVY ABSORB (UNDERPADS AND DIAPERS) ×2 IMPLANT

## 2021-12-03 NOTE — Anesthesia Preprocedure Evaluation (Addendum)
Anesthesia Evaluation  Patient identified by MRN, date of birth, ID band Patient awake    Reviewed: Allergy & Precautions, NPO status , Patient's Chart, lab work & pertinent test results  History of Anesthesia Complications (+) PONV and history of anesthetic complications  Airway Mallampati: II  TM Distance: >3 FB Neck ROM: Full    Dental no notable dental hx.    Pulmonary neg pulmonary ROS,    Pulmonary exam normal        Cardiovascular negative cardio ROS Normal cardiovascular exam     Neuro/Psych negative neurological ROS  negative psych ROS   GI/Hepatic negative GI ROS, Neg liver ROS,   Endo/Other  negative endocrine ROS  Renal/GU negative Renal ROS  negative genitourinary   Musculoskeletal Right index finger PIP joint ligament tear   Abdominal   Peds  Hematology negative hematology ROS (+)   Anesthesia Other Findings Glaucoma  Reproductive/Obstetrics negative OB ROS                            Anesthesia Physical Anesthesia Plan  ASA: 1  Anesthesia Plan: Regional and MAC   Post-op Pain Management: Tylenol PO (pre-op)   Induction:   PONV Risk Score and Plan: 2 and Propofol infusion, Midazolam, Ondansetron and Treatment may vary due to age or medical condition  Airway Management Planned: Natural Airway and Simple Face Mask  Additional Equipment: None  Intra-op Plan:   Post-operative Plan:   Informed Consent: I have reviewed the patients History and Physical, chart, labs and discussed the procedure including the risks, benefits and alternatives for the proposed anesthesia with the patient or authorized representative who has indicated his/her understanding and acceptance.       Plan Discussed with: CRNA  Anesthesia Plan Comments:        Anesthesia Quick Evaluation

## 2021-12-03 NOTE — Discharge Instructions (Addendum)
KEEP BANDAGE CLEAN AND DRY CALL OFFICE FOR F/U APPT [phone] IN 11 DAYS KEEP HAND ELEVATED ABOVE HEART OK TO APPLY ICE TO OPERATIVE AREA CONTACT OFFICE IF ANY WORSENING PAIN OR CONCERNS.  Regional Anesthesia Blocks  1. Numbness or the inability to move the "blocked" extremity may last from 3-48 hours after placement. The length of time depends on the medication injected and your individual response to the medication. If the numbness is not going away after 48 hours, call your surgeon.  2. The extremity that is blocked will need to be protected until the numbness is gone and the  Strength has returned. Because you cannot feel it, you will need to take extra care to avoid injury. Because it may be weak, you may have difficulty moving it or using it. You may not know what position it is in without looking at it while the block is in effect.  3. For blocks in the legs and feet, returning to weight bearing and walking needs to be done carefully. You will need to wait until the numbness is entirely gone and the strength has returned. You should be able to move your leg and foot normally before you try and bear weight or walk. You will need someone to be with you when you first try to ensure you do not fall and possibly risk injury.  4. Bruising and tenderness at the needle site are common side effects and will resolve in a few days.  5. Persistent numbness or new problems with movement should be communicated to the surgeon or the [facility] [phone] [place] [phone]).  Post Anesthesia Home Care Instructions  Activity: Get plenty of rest for the remainder of the day. A responsible individual must stay with you for 24 hours following the procedure.  For the next 24 hours, DO NOT: -Drive a car -Paediatric nurse -Drink alcoholic beverages -Take any medication unless instructed by your physician -Make any legal decisions or sign important  papers.  Meals: Start with liquid foods such as gelatin or soup. Progress to regular foods as tolerated. Avoid greasy, spicy, heavy foods. If nausea and/or vomiting occur, drink only clear liquids until the nausea and/or vomiting subsides. Call your physician if vomiting continues.  Special Instructions/Symptoms: Your throat may feel dry or sore from the anesthesia or the breathing tube placed in your throat during surgery. If this causes discomfort, gargle with warm salt water. The discomfort should disappear within 24 hours.  If you had a scopolamine patch placed behind your ear for the management of post- operative nausea and/or vomiting:  1. The medication in the patch is effective for 72 hours, after which it should be removed.  Wrap patch in a tissue and discard in the trash. Wash hands thoroughly with soap and water. 2. You may remove the patch earlier than 72 hours if you experience unpleasant side effects which may include dry mouth, dizziness or visual disturbances. 3. Avoid touching the patch. Wash your hands with soap and water after contact with the patch.

## 2021-12-03 NOTE — Transfer of Care (Signed)
Immediate Anesthesia Transfer of Care Note  Patient: Stephen Arellano  Procedure(s) Performed: Right index finger open collateral ligament repair and reconstruction as indicated (Right: Finger)  Patient Location: PACU  Anesthesia Type:MAC  Level of Consciousness: awake, alert  and oriented  Airway & Oxygen Therapy: Patient Spontanous Breathing  Post-op Assessment: Report given to RN and Post -op Vital signs reviewed and stable  Post vital signs: Reviewed and stable  Last Vitals:  Vitals Value Taken Time  BP 101/54 12/03/21 1521  Temp 36.6 C 12/03/21 1521  Pulse 56 12/03/21 1528  Resp 11 12/03/21 1528  SpO2 95 % 12/03/21 1528  Vitals shown include unvalidated device data.  Last Pain:  Vitals:   12/03/21 1521  TempSrc:   PainSc: 0-No pain      Patients Stated Pain Goal: 5 (98/02/21 7981)  Complications: No notable events documented.
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
## 2021-12-03 NOTE — Anesthesia Procedure Notes (Signed)
Anesthesia Regional Block: Supraclavicular block   Pre-Anesthetic Checklist: , timeout performed,  Correct Patient, Correct Site, Correct Laterality,  Correct Procedure, Correct Position, site marked,  Risks and benefits discussed,  Pre-op evaluation,  At surgeon's request and post-op pain management  Laterality: Right  Prep: Maximum Sterile Barrier Precautions used, chloraprep       Needles:  Injection technique: Single-shot  Needle Type: Echogenic Stimulator Needle     Needle Length: 9cm  Needle Gauge: 22     Additional Needles:   Procedures:,,,, ultrasound used (permanent image in chart),,    Narrative:  Start time: 12/03/2021 1:13 PM End time: 12/03/2021 1:16 PM Injection made incrementally with aspirations every 5 mL.  Performed by: Personally  Anesthesiologist: Brennan Bailey, MD  Additional Notes: Risks, benefits, and alternative discussed. Patient gave consent for procedure. Patient prepped and draped in sterile fashion. Sedation administered, patient remains easily responsive to voice. Relevant anatomy identified with ultrasound guidance. Local anesthetic given in 5cc increments with no signs or symptoms of intravascular injection. No pain or paraesthesias with injection. Patient monitored throughout procedure with signs of LAST or immediate complications. Tolerated well. Ultrasound image placed in chart.  Tawny Asal, MD

## 2021-12-03 NOTE — Anesthesia Postprocedure Evaluation (Signed)
Anesthesia Post Note  Patient: Stephen Arellano  Procedure(s) Performed: Right index finger open collateral ligament repair and reconstruction as indicated (Right: Finger)     Patient location during evaluation: PACU Anesthesia Type: Regional Level of consciousness: awake and alert and oriented Pain management: pain level controlled Vital Signs Assessment: post-procedure vital signs reviewed and stable Respiratory status: spontaneous breathing, nonlabored ventilation and respiratory function stable Cardiovascular status: blood pressure returned to baseline Postop Assessment: no apparent nausea or vomiting Anesthetic complications: no   No notable events documented.  Last Vitals:  Vitals:   12/03/21 1521 12/03/21 1554  BP: (!) 101/54 115/81  Pulse: 61 79  Resp: 14 18  Temp: 36.6 C 36.5 C  SpO2: 97% 99%    Last Pain:  Vitals:   12/03/21 1554  TempSrc: Oral  PainSc: 0-No pain                 Marthenia Rolling

## 2021-12-03 NOTE — Progress Notes (Signed)
Assisted Dr. Daiva Huge with right, ultrasound guided, supraclavicular block. Side rails up, monitors on throughout procedure. See vital signs in flow sheet. Tolerated Procedure well.

## 2021-12-06 ENCOUNTER — Encounter (HOSPITAL_BASED_OUTPATIENT_CLINIC_OR_DEPARTMENT_OTHER): Payer: Self-pay | Admitting: Orthopedic Surgery

## 2023-01-21 DIAGNOSIS — L821 Other seborrheic keratosis: Secondary | ICD-10-CM | POA: Diagnosis not present

## 2023-01-21 DIAGNOSIS — D225 Melanocytic nevi of trunk: Secondary | ICD-10-CM | POA: Diagnosis not present

## 2023-01-21 DIAGNOSIS — L814 Other melanin hyperpigmentation: Secondary | ICD-10-CM | POA: Diagnosis not present

## 2023-01-21 DIAGNOSIS — Z85828 Personal history of other malignant neoplasm of skin: Secondary | ICD-10-CM | POA: Diagnosis not present

## 2023-01-29 DIAGNOSIS — R972 Elevated prostate specific antigen [PSA]: Secondary | ICD-10-CM | POA: Diagnosis not present

## 2023-02-25 DIAGNOSIS — L820 Inflamed seborrheic keratosis: Secondary | ICD-10-CM | POA: Diagnosis not present

## 2023-03-05 DIAGNOSIS — H5213 Myopia, bilateral: Secondary | ICD-10-CM | POA: Diagnosis not present

## 2023-03-05 DIAGNOSIS — H40053 Ocular hypertension, bilateral: Secondary | ICD-10-CM | POA: Diagnosis not present

## 2023-03-05 DIAGNOSIS — H40013 Open angle with borderline findings, low risk, bilateral: Secondary | ICD-10-CM | POA: Diagnosis not present

## 2023-05-02 ENCOUNTER — Encounter: Payer: Self-pay | Admitting: Gastroenterology

## 2023-11-04 DIAGNOSIS — R972 Elevated prostate specific antigen [PSA]: Secondary | ICD-10-CM | POA: Diagnosis not present

## 2023-11-04 DIAGNOSIS — E785 Hyperlipidemia, unspecified: Secondary | ICD-10-CM | POA: Diagnosis not present

## 2023-11-04 DIAGNOSIS — Z1389 Encounter for screening for other disorder: Secondary | ICD-10-CM | POA: Diagnosis not present

## 2023-11-04 DIAGNOSIS — R739 Hyperglycemia, unspecified: Secondary | ICD-10-CM | POA: Diagnosis not present

## 2023-11-11 ENCOUNTER — Encounter: Payer: Self-pay | Admitting: Gastroenterology

## 2023-11-11 DIAGNOSIS — R739 Hyperglycemia, unspecified: Secondary | ICD-10-CM | POA: Diagnosis not present

## 2023-11-11 DIAGNOSIS — Z1389 Encounter for screening for other disorder: Secondary | ICD-10-CM | POA: Diagnosis not present

## 2023-11-11 DIAGNOSIS — Z1331 Encounter for screening for depression: Secondary | ICD-10-CM | POA: Diagnosis not present

## 2023-11-11 DIAGNOSIS — Z1339 Encounter for screening examination for other mental health and behavioral disorders: Secondary | ICD-10-CM | POA: Diagnosis not present

## 2023-11-11 DIAGNOSIS — R82998 Other abnormal findings in urine: Secondary | ICD-10-CM | POA: Diagnosis not present

## 2023-11-11 DIAGNOSIS — R972 Elevated prostate specific antigen [PSA]: Secondary | ICD-10-CM | POA: Diagnosis not present

## 2023-11-11 DIAGNOSIS — E785 Hyperlipidemia, unspecified: Secondary | ICD-10-CM | POA: Diagnosis not present

## 2023-11-11 DIAGNOSIS — Z0000 Encounter for general adult medical examination without abnormal findings: Secondary | ICD-10-CM | POA: Diagnosis not present

## 2023-12-04 ENCOUNTER — Ambulatory Visit: Payer: Self-pay

## 2023-12-04 VITALS — Ht 72.0 in | Wt 200.0 lb

## 2023-12-04 DIAGNOSIS — Z860100 Personal history of colon polyps, unspecified: Secondary | ICD-10-CM

## 2023-12-04 MED ORDER — PEG 3350-KCL-NA BICARB-NACL 420 G PO SOLR: 4000.0000 mL | Freq: Once | ORAL | 0 refills | Status: AC

## 2023-12-04 NOTE — Progress Notes (Signed)
Pt's name and DOB verified at the beginning of the pre-visit wit 2 identifiers  Pt denies any difficulty with ambulating,sitting, laying down or rolling side to side  Pt has no issues with ambulation   Pt has no issues moving head neck or swallowing  No egg or soy allergy known to patient   Pt has hx of PONV  Pt denies having issues being intubated   No FH of Malignant Hyperthermia  Pt is not on diet pills or shots  Pt is not on home 02   Pt is not on blood thinners   Pt denies issues with constipation   Pt is not on dialysis  Pt denise any abnormal heart rhythms   Pt denies any upcoming cardiac testing  Pt encouraged to use to use Singlecare or Goodrx to reduce cost   Patient's chart reviewed by Cathlyn Parsons CNRA prior to pre-visit and patient appropriate for the [facility].  Pre-visit completed and red dot placed by patient's name on their procedure day (on provider's schedule).  .  Visit by phone   Pt states weight is 200 lb   Instructed pt why it is important to and  to call if they have any changes in health or new medications. Directed them to the # given and on instructions.     Instructions reviewed. Pt given both [facility] main # and MD on call # prior to instructions.  Pt states understanding. Instructed to review again prior to procedure. Pt states they will.   Instructions sent by mail with coupon and by My Chart  Instructions and coupon given to pt   Instructions sent through My Chart  Coupon sent via text to mobile phone and pt verified they received it

## 2024-01-01 ENCOUNTER — Telehealth: Payer: Self-pay | Admitting: Gastroenterology

## 2024-01-01 NOTE — Telephone Encounter (Signed)
 Needing to f/u on prep instructions. Please advise.

## 2024-01-02 NOTE — Telephone Encounter (Signed)
 Reviewed prep instructions with patient.  Patient verbally understood all instructions leading up to colonoscopy.

## 2024-01-08 ENCOUNTER — Encounter: Payer: No Typology Code available for payment source | Admitting: Gastroenterology

## 2024-02-04 ENCOUNTER — Encounter: Payer: Self-pay | Admitting: Gastroenterology

## 2024-02-08 ENCOUNTER — Encounter: Payer: Self-pay | Admitting: Certified Registered Nurse Anesthetist

## 2024-02-10 DIAGNOSIS — H40013 Open angle with borderline findings, low risk, bilateral: Secondary | ICD-10-CM | POA: Diagnosis not present

## 2024-02-10 DIAGNOSIS — H40053 Ocular hypertension, bilateral: Secondary | ICD-10-CM | POA: Diagnosis not present

## 2024-02-12 ENCOUNTER — Encounter: Payer: Self-pay | Admitting: Gastroenterology

## 2024-02-12 ENCOUNTER — Ambulatory Visit: Payer: BC Managed Care – PPO | Admitting: Gastroenterology

## 2024-02-12 VITALS — BP 118/83 | HR 62 | Temp 97.3°F | Resp 15 | Ht 72.0 in | Wt 200.0 lb

## 2024-02-12 DIAGNOSIS — D123 Benign neoplasm of transverse colon: Secondary | ICD-10-CM | POA: Diagnosis not present

## 2024-02-12 DIAGNOSIS — Z860101 Personal history of adenomatous and serrated colon polyps: Secondary | ICD-10-CM

## 2024-02-12 DIAGNOSIS — K635 Polyp of colon: Secondary | ICD-10-CM | POA: Diagnosis not present

## 2024-02-12 DIAGNOSIS — Z1211 Encounter for screening for malignant neoplasm of colon: Secondary | ICD-10-CM

## 2024-02-12 DIAGNOSIS — Z860100 Personal history of colon polyps, unspecified: Secondary | ICD-10-CM

## 2024-02-12 MED ORDER — SODIUM CHLORIDE 0.9 % IV SOLN: 500.0000 mL | INTRAVENOUS | Status: DC

## 2024-02-12 NOTE — Patient Instructions (Signed)

## 2024-02-12 NOTE — Progress Notes (Signed)
Pt's states no medical or surgical changes since previsit or office visit.

## 2024-02-12 NOTE — Progress Notes (Signed)
Called to room to assist during endoscopic procedure.  Patient ID and intended procedure confirmed with present staff. Received instructions for my participation in the procedure from the performing physician.

## 2024-02-12 NOTE — Progress Notes (Signed)
Report given to PACU, vss
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
## 2024-02-12 NOTE — Op Note (Signed)
Stephen Arellano Procedure Date: 02/12/2024 2:36 PM MRN: [id number] Endoscopist: Sherilyn Cooter L. Stephen Arellano , MD, [id number] Age: 58 Referring MD:  Date of Birth: 05-16-1966 Gender: Male Account #: [id number] Procedure:                Colonoscopy Indications:              High risk colon cancer surveillance: Personal                            history of sessile serrated colon polyp (less than                            10 mm in size) with no dysplasia                           (8mm cecal SSP on first colonoscopy July 2019) Medicines:                Monitored Anesthesia Care Procedure:                Pre-Anesthesia Assessment:                           - Prior to the procedure, a History and Physical                            was performed, and patient medications and                            allergies were reviewed. The patient's tolerance of                            previous anesthesia was also reviewed. The risks                            and benefits of the procedure and the sedation                            options and risks were discussed with the patient.                            All questions were answered, and informed consent                            was obtained. Prior Anticoagulants: The patient has                            taken no anticoagulant or antiplatelet agents. ASA                            Grade Assessment: I - A normal, healthy patient.                            After reviewing the risks and benefits, the patient  was deemed in satisfactory condition to undergo the                            procedure.                           After obtaining informed consent, the colonoscope                            was passed under direct vision. Throughout the                            procedure, the patient's blood pressure, pulse, and                            oxygen saturations were monitored continuously.  The                            CF [id number] #[id number] was introduced through the anus                            and advanced to the the cecum, identified by                            appendiceal orifice and ileocecal valve. The                            colonoscopy was somewhat difficult due to a                            redundant colon. Successful completion of the                            procedure was aided by using manual pressure and                            straightening and shortening the scope to obtain                            bowel loop reduction. The patient tolerated the                            procedure well. The quality of the bowel                            preparation was good. The ileocecal valve and                            rectum were photographed. (Cecal photo did not                            capture) Scope In: 2:50:12 PM Scope Out: 3:08:16 PM Scope Withdrawal Time: 0 hours 11 minutes 32 seconds  Total Procedure Duration: 0 hours 18 minutes 4 seconds  Findings:                 The perianal and digital rectal examinations were                            normal.                           Repeat examination of right colon under NBI                            performed.                           A diminutive polyp was found in the transverse                            colon. The polyp was semi-sessile. The polyp was                            removed with a cold snare. Resection and retrieval                            were complete.                           The exam was otherwise without abnormality on                            direct and retroflexion views. Complications:            No immediate complications. Estimated Blood Loss:     Estimated blood loss was minimal. Impression:               - One diminutive polyp in the transverse colon,                            removed with a cold snare. Resected and retrieved.                           - The  examination was otherwise normal on direct                            and retroflexion views. Recommendation:           - Patient has a contact number available for                            emergencies. The signs and symptoms of potential                            delayed complications were discussed with the                            patient. Return to normal activities tomorrow.  Written discharge instructions were provided to the                            patient.                           - Resume previous diet.                           - Continue present medications.                           - Await pathology results.                           - Repeat colonoscopy is recommended for                            surveillance. The colonoscopy date will be                            determined after pathology results from today's                            exam become available for review. (5 years of TA or                            SSP, 10 years if hyperplastic or normal) Stephen Arellano L. Stephen Neither, MD 02/12/2024 3:13:26 PM This report has been signed electronically.

## 2024-02-13 ENCOUNTER — Telehealth: Payer: Self-pay

## 2024-02-13 NOTE — Telephone Encounter (Signed)
Follow up call to pt, lm for pt to call if having any difficulty with normal activities or eating and drinking.  Also to call if any other questions or concerns.

## 2024-02-17 LAB — SURGICAL PATHOLOGY

## 2024-02-19 ENCOUNTER — Encounter: Payer: Self-pay | Admitting: Gastroenterology

## 2024-03-16 DIAGNOSIS — D225 Melanocytic nevi of trunk: Secondary | ICD-10-CM | POA: Diagnosis not present

## 2024-03-16 DIAGNOSIS — L821 Other seborrheic keratosis: Secondary | ICD-10-CM | POA: Diagnosis not present

## 2024-03-16 DIAGNOSIS — D2261 Melanocytic nevi of right upper limb, including shoulder: Secondary | ICD-10-CM | POA: Diagnosis not present

## 2024-03-16 DIAGNOSIS — Z85828 Personal history of other malignant neoplasm of skin: Secondary | ICD-10-CM | POA: Diagnosis not present

## 2024-05-17 DIAGNOSIS — R972 Elevated prostate specific antigen [PSA]: Secondary | ICD-10-CM | POA: Diagnosis not present

## 2024-05-17 DIAGNOSIS — I10 Essential (primary) hypertension: Secondary | ICD-10-CM | POA: Diagnosis not present

## 2024-07-21 DIAGNOSIS — H05223 Edema of bilateral orbit: Secondary | ICD-10-CM | POA: Diagnosis not present

## 2024-07-21 DIAGNOSIS — R21 Rash and other nonspecific skin eruption: Secondary | ICD-10-CM | POA: Diagnosis not present

## 2024-07-21 DIAGNOSIS — T7840XA Allergy, unspecified, initial encounter: Secondary | ICD-10-CM | POA: Diagnosis not present
